# Patient Record
Sex: Female | Born: 1985 | Race: Asian | Hispanic: No | Marital: Single | Smoking: Never smoker
Health system: Southern US, Community
[De-identification: ages and names within clinical notes are randomized; demographics above are authoritative.]

## PROBLEM LIST (undated history)

## (undated) ENCOUNTER — Emergency Department (HOSPITAL_COMMUNITY): Discharge status: Against Medical Advice | Payer: BC Managed Care – PPO

## (undated) ENCOUNTER — Emergency Department (HOSPITAL_COMMUNITY): Admission: EM | Payer: Self-pay | Source: Home / Self Care

## (undated) ENCOUNTER — Inpatient Hospital Stay (HOSPITAL_COMMUNITY): Payer: Self-pay

## (undated) DIAGNOSIS — Z789 Other specified health status: Secondary | ICD-10-CM

## (undated) HISTORY — PX: KNEE SURGERY: SHX244

---

## 2009-03-19 ENCOUNTER — Emergency Department (HOSPITAL_COMMUNITY): Admission: EM | Admit: 2009-03-19 | Discharge: 2009-03-19 | Payer: Self-pay | Admitting: Emergency Medicine

## 2009-07-18 ENCOUNTER — Ambulatory Visit (HOSPITAL_COMMUNITY): Admission: RE | Admit: 2009-07-18 | Discharge: 2009-07-18 | Payer: Self-pay | Admitting: Obstetrics & Gynecology

## 2009-10-28 ENCOUNTER — Ambulatory Visit: Payer: Self-pay | Admitting: Advanced Practice Midwife

## 2009-10-28 ENCOUNTER — Inpatient Hospital Stay (HOSPITAL_COMMUNITY): Admission: AD | Admit: 2009-10-28 | Discharge: 2009-10-28 | Payer: Self-pay | Admitting: Obstetrics & Gynecology

## 2009-11-03 ENCOUNTER — Inpatient Hospital Stay (HOSPITAL_COMMUNITY): Admission: AD | Admit: 2009-11-03 | Discharge: 2009-11-07 | Payer: Self-pay | Admitting: Obstetrics & Gynecology

## 2009-11-03 ENCOUNTER — Ambulatory Visit: Payer: Self-pay | Admitting: Obstetrics & Gynecology

## 2009-11-03 ENCOUNTER — Encounter: Payer: Self-pay | Admitting: Obstetrics & Gynecology

## 2010-10-09 LAB — CULTURE, BLOOD (ROUTINE X 2)
Culture: NO GROWTH
Culture: NO GROWTH

## 2010-10-09 LAB — DIFFERENTIAL
Basophils Absolute: 0 10*3/uL (ref 0.0–0.1)
Basophils Absolute: 0 10*3/uL (ref 0.0–0.1)
Basophils Relative: 0 % (ref 0–1)
Basophils Relative: 0 % (ref 0–1)
Eosinophils Absolute: 0.1 10*3/uL (ref 0.0–0.7)
Lymphocytes Relative: 4 % — ABNORMAL LOW (ref 12–46)
Lymphs Abs: 0.5 10*3/uL — ABNORMAL LOW (ref 0.7–4.0)
Lymphs Abs: 1.1 10*3/uL (ref 0.7–4.0)
Neutro Abs: 12.2 10*3/uL — ABNORMAL HIGH (ref 1.7–7.7)
Neutro Abs: 7 10*3/uL (ref 1.7–7.7)
Neutrophils Relative %: 72 % (ref 43–77)
Neutrophils Relative %: 81 % — ABNORMAL HIGH (ref 43–77)
Neutrophils Relative %: 93 % — ABNORMAL HIGH (ref 43–77)

## 2010-10-09 LAB — CBC
MCHC: 34.2 g/dL (ref 30.0–36.0)
MCHC: 34.2 g/dL (ref 30.0–36.0)
MCV: 94.8 fL (ref 78.0–100.0)
Platelets: 117 10*3/uL — ABNORMAL LOW (ref 150–400)
Platelets: 129 10*3/uL — ABNORMAL LOW (ref 150–400)
Platelets: 135 10*3/uL — ABNORMAL LOW (ref 150–400)
RBC: 3.01 MIL/uL — ABNORMAL LOW (ref 3.87–5.11)
RBC: 4 MIL/uL (ref 3.87–5.11)
RDW: 15 % (ref 11.5–15.5)
RDW: 15.3 % (ref 11.5–15.5)
RDW: 15.4 % (ref 11.5–15.5)
WBC: 13.1 10*3/uL — ABNORMAL HIGH (ref 4.0–10.5)
WBC: 15.1 10*3/uL — ABNORMAL HIGH (ref 4.0–10.5)
WBC: 9.9 10*3/uL (ref 4.0–10.5)

## 2010-10-09 LAB — URINALYSIS, MICROSCOPIC ONLY
Bilirubin Urine: NEGATIVE
Ketones, ur: NEGATIVE mg/dL
Nitrite: NEGATIVE
Protein, ur: NEGATIVE mg/dL
Urobilinogen, UA: 0.2 mg/dL (ref 0.0–1.0)
pH: 6 (ref 5.0–8.0)

## 2010-10-09 LAB — RPR: RPR Ser Ql: NONREACTIVE

## 2010-10-09 LAB — URINE CULTURE
Colony Count: NO GROWTH
Culture: NO GROWTH
Special Requests: NEGATIVE

## 2010-10-09 LAB — CREATININE, SERUM
Creatinine, Ser: 0.6 mg/dL (ref 0.4–1.2)
GFR calc non Af Amer: >60 mL/min (ref >60)

## 2010-10-27 LAB — POCT I-STAT, CHEM 8
Glucose, Bld: 87 mg/dL (ref 70–99)
HCT: 38 % (ref 36.0–46.0)
Hemoglobin: 12.9 g/dL (ref 12.0–15.0)
Potassium: 3.4 mEq/L — ABNORMAL LOW (ref 3.5–5.1)
Sodium: 137 mEq/L (ref 135–145)
TCO2: 21 mmol/L (ref 0–100)

## 2010-10-27 LAB — URINALYSIS, ROUTINE W REFLEX MICROSCOPIC
Nitrite: NEGATIVE
Specific Gravity, Urine: 1.023 (ref 1.005–1.030)
Urobilinogen, UA: 0.2 mg/dL (ref 0.0–1.0)

## 2010-10-27 LAB — URINE MICROSCOPIC-ADD ON

## 2010-10-27 LAB — URINE CULTURE: Culture: NO GROWTH

## 2011-07-13 ENCOUNTER — Encounter: Payer: Self-pay | Admitting: *Deleted

## 2011-07-13 ENCOUNTER — Emergency Department (HOSPITAL_COMMUNITY)
Admission: EM | Admit: 2011-07-13 | Discharge: 2011-07-13 | Disposition: A | Discharge status: Home / Self Care | Payer: Self-pay | Attending: Emergency Medicine | Admitting: Emergency Medicine

## 2011-07-13 DIAGNOSIS — N390 Urinary tract infection, site not specified: Secondary | ICD-10-CM | POA: Insufficient documentation

## 2011-07-13 LAB — URINALYSIS, ROUTINE W REFLEX MICROSCOPIC
Bilirubin Urine: NEGATIVE
Ketones, ur: NEGATIVE mg/dL
Nitrite: POSITIVE — AB
Protein, ur: 30 mg/dL — AB
Specific Gravity, Urine: 1.025 (ref 1.005–1.030)
Urobilinogen, UA: 0.2 mg/dL (ref 0.0–1.0)

## 2011-07-13 LAB — URINE MICROSCOPIC-ADD ON

## 2011-07-13 MED ORDER — PHENAZOPYRIDINE HCL 200 MG PO TABS: 200.0000 mg | ORAL_TABLET | Freq: Three times a day (TID) | ORAL | Status: AC

## 2011-07-13 MED ORDER — CEPHALEXIN 500 MG PO CAPS: 500.0000 mg | ORAL_CAPSULE | Freq: Four times a day (QID) | ORAL | Status: AC

## 2011-07-13 NOTE — ED Notes (Signed)
Pt states she feels like she has "a urinary tract infection" reports frequent urination

## 2011-07-13 NOTE — ED Provider Notes (Signed)
History     CSN: [id number]  Arrival date & time 07/13/11  1232   First MD Initiated Contact with Patient 07/13/11 1506      Chief Complaint  Patient presents with  . Urinary Tract Infection  . Urinary Frequency    (Consider location/radiation/quality/duration/timing/severity/associated sxs/prior treatment) Patient is a 25 y.o. female presenting with frequency. The history is provided by the patient.  Urinary Frequency  reports dysuria and urinary frequency x 24 hours. No vomiting, abdominal pain, nausea, fever, chills of flank pain. Denies vaginal discharge. Constant. Moderate in severity. No other associated symptoms  History reviewed. No pertinent past medical history.  Past Surgical History  Procedure Date  . Cesarean section     No family history on file.  History  Substance Use Topics  . Smoking status: Never Smoker   . Smokeless tobacco: Not on file  . Alcohol Use: Yes    OB History    Grav Para Term Preterm Abortions TAB SAB Ect Mult Living                  Review of Systems  Genitourinary: Positive for frequency.  All other systems reviewed and are negative.    Allergies  Penicillins  Home Medications   Current Outpatient Rx  Name Route Sig Dispense Refill  . CEPHALEXIN 500 MG PO CAPS Oral Take 1 capsule (500 mg total) by mouth 4 (four) times daily. 20 capsule 0  . PHENAZOPYRIDINE HCL 200 MG PO TABS Oral Take 1 tablet (200 mg total) by mouth 3 (three) times daily. 6 tablet 0    BP 111/68  Pulse 88  Temp(Src) 98.1 F (36.7 C) (Oral)  Resp 16  SpO2 99%  LMP 06/15/2011  Physical Exam  Nursing note and vitals reviewed. Constitutional: She is oriented to person, place, and time. She appears well-developed and well-nourished. No distress.  HENT:  Head: Normocephalic and atraumatic.  Eyes: EOM are normal.  Neck: Normal range of motion.  Cardiovascular: Normal rate and regular rhythm.   Pulmonary/Chest: Effort normal and breath sounds  normal.  Abdominal: Soft. She exhibits no distension. There is no tenderness.  Musculoskeletal: Normal range of motion.  Neurological: She is alert and oriented to person, place, and time.  Skin: Skin is warm and dry.  Psychiatric: She has a normal mood and affect. Judgment normal.    ED Course  Procedures (including critical care time)  Labs Reviewed  URINALYSIS, ROUTINE W REFLEX MICROSCOPIC - Abnormal; Notable for the following:    APPearance CLOUDY (*)    Hgb urine dipstick MODERATE (*)    Protein, ur 30 (*)    Nitrite POSITIVE (*)    Leukocytes, UA MODERATE (*)    All other components within normal limits  URINE MICROSCOPIC-ADD ON - Abnormal; Notable for the following:    Bacteria, UA MANY (*)    All other components within normal limits  PREGNANCY, URINE   No results found.   1. Urinary tract infection       MDM  Will tx for uncomplicated UTI        Lyanne Co, MD 07/13/11 [phone]

## 2012-03-30 ENCOUNTER — Emergency Department (HOSPITAL_COMMUNITY)
Admission: EM | Admit: 2012-03-30 | Discharge: 2012-03-30 | Disposition: A | Discharge status: Home / Self Care | Payer: Self-pay

## 2012-03-30 NOTE — ED Notes (Signed)
Pt called multiple times with no reply for triage

## 2012-09-30 ENCOUNTER — Emergency Department (HOSPITAL_COMMUNITY)
Admission: EM | Admit: 2012-09-30 | Discharge: 2012-10-01 | Disposition: A | Discharge status: Home / Self Care | Payer: BC Managed Care – PPO | Attending: Emergency Medicine | Admitting: Emergency Medicine

## 2012-09-30 ENCOUNTER — Encounter (HOSPITAL_COMMUNITY): Payer: Self-pay | Admitting: *Deleted

## 2012-09-30 DIAGNOSIS — R45851 Suicidal ideations: Secondary | ICD-10-CM | POA: Insufficient documentation

## 2012-09-30 DIAGNOSIS — X789XXA Intentional self-harm by unspecified sharp object, initial encounter: Secondary | ICD-10-CM | POA: Insufficient documentation

## 2012-09-30 DIAGNOSIS — F502 Bulimia nervosa, unspecified: Secondary | ICD-10-CM | POA: Insufficient documentation

## 2012-09-30 DIAGNOSIS — F3130 Bipolar disorder, current episode depressed, mild or moderate severity, unspecified: Secondary | ICD-10-CM | POA: Insufficient documentation

## 2012-09-30 DIAGNOSIS — S51809A Unspecified open wound of unspecified forearm, initial encounter: Secondary | ICD-10-CM | POA: Insufficient documentation

## 2012-09-30 DIAGNOSIS — Z79899 Other long term (current) drug therapy: Secondary | ICD-10-CM | POA: Insufficient documentation

## 2012-09-30 DIAGNOSIS — Z3202 Encounter for pregnancy test, result negative: Secondary | ICD-10-CM | POA: Insufficient documentation

## 2012-09-30 LAB — CBC WITH DIFFERENTIAL/PLATELET
Basophils Absolute: 0 10*3/uL (ref 0.0–0.1)
Eosinophils Relative: 1 % (ref 0–5)
HCT: 38 % (ref 36.0–46.0)
Hemoglobin: 13.8 g/dL (ref 12.0–15.0)
Lymphocytes Relative: 45 % (ref 12–46)
Lymphs Abs: 4 10*3/uL (ref 0.7–4.0)
MCV: 93.8 fL (ref 78.0–100.0)
Monocytes Absolute: 0.4 10*3/uL (ref 0.1–1.0)
Neutro Abs: 4.3 10*3/uL (ref 1.7–7.7)
RBC: 4.05 MIL/uL (ref 3.87–5.11)
RDW: 12 % (ref 11.5–15.5)
WBC: 8.8 10*3/uL (ref 4.0–10.5)

## 2012-09-30 LAB — ETHANOL: Alcohol, Ethyl (B): 110 mg/dL — ABNORMAL HIGH (ref 0–11)

## 2012-09-30 LAB — BASIC METABOLIC PANEL
CO2: 23 mEq/L (ref 19–32)
Chloride: 109 mEq/L (ref 96–112)
Creatinine, Ser: 0.74 mg/dL (ref 0.50–1.10)
Glucose, Bld: 71 mg/dL (ref 70–99)

## 2012-09-30 MED ORDER — ACETAMINOPHEN 325 MG PO TABS: 650.0000 mg | ORAL_TABLET | ORAL | Status: DC | PRN

## 2012-09-30 MED ORDER — IBUPROFEN 600 MG PO TABS: 600.0000 mg | ORAL_TABLET | Freq: Three times a day (TID) | ORAL | Status: DC | PRN

## 2012-09-30 MED ORDER — ALUM & MAG HYDROXIDE-SIMETH 200-200-20 MG/5ML PO SUSP: 30.0000 mL | ORAL | Status: DC | PRN

## 2012-09-30 MED ORDER — NICOTINE 21 MG/24HR TD PT24: 21.0000 mg | MEDICATED_PATCH | Freq: Every day | TRANSDERMAL | Status: DC

## 2012-09-30 MED ORDER — ZOLPIDEM TARTRATE 5 MG PO TABS
5.0000 mg | ORAL_TABLET | Freq: Every evening | ORAL | Status: DC | PRN
  Administered 2012-10-01: 5 mg via ORAL
  Filled 2012-09-30: qty 1

## 2012-09-30 MED ORDER — LORAZEPAM 1 MG PO TABS
1.0000 mg | ORAL_TABLET | Freq: Three times a day (TID) | ORAL | Status: DC | PRN
  Administered 2012-10-01: 1 mg via ORAL
  Filled 2012-09-30: qty 1

## 2012-09-30 MED ORDER — ONDANSETRON HCL 4 MG PO TABS: 4.0000 mg | ORAL_TABLET | Freq: Three times a day (TID) | ORAL | Status: DC | PRN
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
## 2012-09-30 NOTE — ED Provider Notes (Signed)
History    This chart was scribed for Glade Nurse, PA-C a non-physician practitioner working with Flint Melter, MD by Lewanda Rife, ED Scribe. This patient was seen in room [facility] and the patient's care was started at 2259.     CSN: [id number]  Arrival date & time 09/30/12  2149   First MD Initiated Contact with Patient 09/30/12 2256      Chief Complaint  Patient presents with  . Medical Clearance    (Consider location/radiation/quality/duration/timing/severity/associated sxs/prior treatment) HPI Virginia Hunter is a 27 y.o. female brought in by ambulance, who presents to the Emergency Department complaining of intermittent suicidal ideations onset 7 days. Pt reports superficial cutting on left forearm with razors 7 days ago and states precipitant of depression is "life stressors." Pt admits to SI, but denies having a plan of suicide. Pt denies homicidal ideations, auditory/visual hallucinations. Pt reports having several glasses of Christiane Ha today and 1 xanax. Denies any other drug use. Pt reports hx of the same for past 7 years. Pt reports she called her psychiatrist today and was advised to come to ED. Pt reports tetanus status is unknown.   History reviewed. No pertinent past medical history.  Past Surgical History  Procedure Laterality Date  . Cesarean section      No family history on file.  History  Substance Use Topics  . Smoking status: Never Smoker   . Smokeless tobacco: Not on file  . Alcohol Use: Yes    OB History   Grav Para Term Preterm Abortions TAB SAB Ect Mult Living                  Review of Systems  Constitutional: Negative for fever and diaphoresis.  HENT: Negative for neck pain and neck stiffness.   Eyes: Negative for visual disturbance.  Respiratory: Negative for apnea, chest tightness and shortness of breath.   Cardiovascular: Negative for chest pain and palpitations.  Gastrointestinal: Negative for nausea, vomiting,  diarrhea and constipation.  Genitourinary: Negative for dysuria.  Musculoskeletal: Negative for gait problem.  Skin:       Healing, scabbed superficial lacerations to left forearm.  Neurological: Negative for dizziness, weakness, light-headedness, numbness and headaches.  Psychiatric/Behavioral: Positive for suicidal ideas and self-injury. Negative for hallucinations. The patient is nervous/anxious.    A complete 10 system review of systems was obtained and all systems are negative except as noted in the HPI and PMH.   Allergies  Penicillins  Home Medications   Current Outpatient Rx  Name  Route  Sig  Dispense  Refill  . ALPRAZolam (XANAX PO)   Oral   Take 1 tablet by mouth every morning.         Marland Kitchen HYDROcodone-acetaminophen (NORCO/VICODIN) 5-325 MG per tablet   Oral   Take 1 tablet by mouth every 6 (six) hours as needed for pain.           BP 116/76  Pulse 118  Temp(Src) 98.4 F (36.9 C) (Oral)  Resp 18  SpO2 100%  LMP 09/16/2012  Physical Exam  Nursing note and vitals reviewed. Constitutional: She is oriented to person, place, and time.  disheveled  HENT:  Head: Normocephalic and atraumatic.  Eyes: Conjunctivae and EOM are normal.  Neck: Normal range of motion. Neck supple.  No meningeal signs  Cardiovascular: Normal rate.   Pulmonary/Chest: Effort normal and breath sounds normal. No respiratory distress. She has no wheezes. She has no rales. She exhibits no tenderness.  Abdominal:  Soft.  Musculoskeletal: Normal range of motion. She exhibits no edema and no tenderness.  Neurological: She is alert and oriented to person, place, and time.  Skin: Skin is warm and dry. Laceration noted. She is not diaphoretic. No erythema.     Multiple scabbed over superficial lacerations to left forearm healing appropriately. Not purulent, warm to touch.  Psychiatric: Her speech is normal. She is withdrawn. She is not actively hallucinating. She expresses inappropriate judgment.  She exhibits a depressed mood. She expresses suicidal ideation. She expresses no homicidal ideation. She expresses no suicidal plans and no homicidal plans.  Crying intermittently, hiding face with hair    ED Course  Procedures (including critical care time) Medications  LORazepam (ATIVAN) tablet 1 mg (not administered)  acetaminophen (TYLENOL) tablet 650 mg (not administered)  ibuprofen (ADVIL,MOTRIN) tablet 600 mg (not administered)  zolpidem (AMBIEN) tablet 5 mg (not administered)  nicotine (NICODERM CQ - dosed in mg/24 hours) patch 21 mg (not administered)  ondansetron (ZOFRAN) tablet 4 mg (not administered)  alum & mag hydroxide-simeth (MAALOX/MYLANTA) 200-200-20 MG/5ML suspension 30 mL (not administered)    Labs Reviewed  CBC WITH DIFFERENTIAL - Abnormal; Notable for the following:    MCH 34.1 (*)    MCHC 36.3 (*)    All other components within normal limits  ETHANOL - Abnormal; Notable for the following:    Alcohol, Ethyl (B) 110 (*)    All other components within normal limits  URINE RAPID DRUG SCREEN (HOSP PERFORMED) - Abnormal; Notable for the following:    Benzodiazepines POSITIVE (*)    Tetrahydrocannabinol POSITIVE (*)    All other components within normal limits  BASIC METABOLIC PANEL  PREGNANCY, URINE   No results found.   No diagnosis found.    MDM  Pt expresses SI without plan tonight. Pt has been suicidal and depressed (reasons for which she did not elaborate indicating life stressors) for the past week, having cut up her left arm two days ago. Pt presents to the ED for medical clearance.  The patient's demeanor is dysphoric. Admits difficulty sleeping, loss of interests, feelings of worthlessness, lack of energy, difficulty concentrating, loss of appetite, feelings of anxiety.  The patient currently does not have any acute physical complaints and is in no acute distress.ACT consult was appreciated and pt was moved to Psych ED for further evaluation.  I  personally performed the services described in this documentation, which was scribed in my presence. The recorded information has been reviewed and is accurate.    Glade Nurse, PA-C 10/01/12 [phone]

## 2012-09-30 NOTE — ED Notes (Signed)
Patient arrived to unit, pt tearful but currently verbally contracts for safety.

## 2012-10-01 DIAGNOSIS — F502 Bulimia nervosa: Secondary | ICD-10-CM | POA: Diagnosis present

## 2012-10-01 DIAGNOSIS — F3130 Bipolar disorder, current episode depressed, mild or moderate severity, unspecified: Secondary | ICD-10-CM

## 2012-10-01 DIAGNOSIS — F1210 Cannabis abuse, uncomplicated: Secondary | ICD-10-CM

## 2012-10-01 LAB — RAPID URINE DRUG SCREEN, HOSP PERFORMED
Amphetamines: NOT DETECTED
Benzodiazepines: POSITIVE — AB
Opiates: NOT DETECTED
Tetrahydrocannabinol: POSITIVE — AB

## 2012-10-01 LAB — PREGNANCY, URINE: Preg Test, Ur: NEGATIVE

## 2012-10-01 MED ORDER — LAMOTRIGINE 25 MG PO TABS: 25.0000 mg | ORAL_TABLET | Freq: Two times a day (BID) | ORAL | Status: DC

## 2012-10-01 MED ORDER — FLUOXETINE HCL 10 MG PO CAPS: 10.0000 mg | ORAL_CAPSULE | Freq: Every day | ORAL | Status: DC

## 2012-10-01 MED ORDER — TETANUS-DIPHTH-ACELL PERTUSSIS 5-2.5-18.5 LF-MCG/0.5 IM SUSP
0.5000 mL | Freq: Once | INTRAMUSCULAR | Status: DC
  Filled 2012-10-01: qty 0.5

## 2012-10-01 MED ORDER — ZOLPIDEM TARTRATE 5 MG PO TABS: 5.0000 mg | ORAL_TABLET | Freq: Every evening | ORAL | Status: DC | PRN
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
## 2012-10-01 NOTE — ED Provider Notes (Signed)
Medical screening examination/treatment/procedure(s) were performed by non-physician practitioner and as supervising physician I was immediately available for consultation/collaboration.  Flint Melter, MD 10/01/12 [phone]

## 2012-10-01 NOTE — BHH Counselor (Signed)
Patient discharged home per Dr. Elsie Saas. Patient was given several community mental health referrals.

## 2012-10-01 NOTE — ED Notes (Signed)
Pt is aware that she is going to be dc'd.  Pt reports she is safe going home.  Pt encouraged to follow up w/ her psychitrist as soon and possible and return/seek treatment if she has any thoughts of harming herself.  Pt verbalized understanding and reported that she would.

## 2012-10-01 NOTE — Progress Notes (Signed)
CSW faxed the patient for review at the following hospitals: - [facility] [phone] [facility] [phone] - [place] [phone] - [facility] [phone] [facility] [phone] [facility] [phone] Earlene Plater [phone] [facility] [phone] - [place] [phone] - [address] [facility] [phone] - [facility] [phone]  There were no beds available at: [place], [place], [place], [place], [place], [place]

## 2012-10-01 NOTE — ED Notes (Signed)
Up to the desk on the phone 

## 2012-10-01 NOTE — BHH Counselor (Signed)
3/13 Pt referred to [facility],  [facility], [place],   [facility], [facility] expecting d/c's later today, [facility] possible may have a geri-psych admission,   [facility], [place], [place], [place], and [facility]   3/13 There were no beds available at: [address], [place], [place], [place], [place], and [place].  3/13 Patient referred to [place] declined at [facility] due to bulimia. Sts that bulimia is exclusionary criteria for their facility.

## 2012-10-01 NOTE — ED Notes (Signed)
Waiting for ride to arrive
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
## 2012-10-01 NOTE — ED Notes (Signed)
Written dc instructions reviewed w/ pt.  Pt encouraged to follow up as instructed and return for toughts/impulses to harm herself, pt agreed to do so, and verbalized understanding of instructions.  Belongings returned after leaving the unit.

## 2012-10-01 NOTE — ED Notes (Signed)
Pt's husband is here to pick her up.

## 2012-10-01 NOTE — Progress Notes (Signed)
Pt has been accepted to [facility] pending IVC paperwork being served.   Accepting Physician: [place] Report #: [phone] Fax: [phone]   CSW will ask evening CSW to f/u with Magistrate re: IVC paperwork and advise EDP and RN when pt has been served to d/c to [facility].    Vickii Penna, LCSWA [phone]  Clinical Social Work

## 2012-10-01 NOTE — ED Provider Notes (Signed)
Pt awaiting placement Labs/vitals reviewed D/w ACT about her care and attempting placement BP 107/71  Pulse 78  Temp(Src) 98.3 F (36.8 C) (Oral)  Resp 16  SpO2 98%  LMP 09/16/2012   Joya Gaskins, MD 10/01/12 [phone]

## 2012-10-01 NOTE — ED Notes (Signed)
Dr. Elsie Saas into see

## 2012-10-01 NOTE — Progress Notes (Signed)
Pt was denied at [facility] and [place] based on acuity- no programs for eating disorders. Vickii Penna, LCSWA [phone]  Clinical Social Work

## 2012-10-01 NOTE — Progress Notes (Signed)
Spoke with Reuel Boom at [facility] no beds  Spoke with Rosann Auerbach at [employer] with Toniann Fail at [facility] one female bed available/ information faxed.
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
## 2012-10-01 NOTE — ED Notes (Signed)
Up to the bathroom 

## 2013-05-21 LAB — OB RESULTS CONSOLE ABO/RH: RH TYPE: POSITIVE

## 2013-05-21 LAB — OB RESULTS CONSOLE ANTIBODY SCREEN: Antibody Screen: NEGATIVE

## 2013-05-21 LAB — OB RESULTS CONSOLE HEPATITIS B SURFACE ANTIGEN: Hepatitis B Surface Ag: NEGATIVE

## 2013-05-21 LAB — OB RESULTS CONSOLE HIV ANTIBODY (ROUTINE TESTING): HIV: NONREACTIVE

## 2013-05-21 LAB — OB RESULTS CONSOLE RUBELLA ANTIBODY, IGM: Rubella: IMMUNE

## 2013-05-21 LAB — OB RESULTS CONSOLE RPR: RPR: NONREACTIVE

## 2013-05-26 ENCOUNTER — Other Ambulatory Visit: Payer: Self-pay | Admitting: Obstetrics & Gynecology

## 2013-05-26 ENCOUNTER — Other Ambulatory Visit (HOSPITAL_COMMUNITY)
Admission: RE | Admit: 2013-05-26 | Discharge: 2013-05-26 | Disposition: A | Discharge status: Home / Self Care | Payer: BC Managed Care – PPO | Source: Ambulatory Visit | Attending: Obstetrics & Gynecology | Admitting: Obstetrics & Gynecology

## 2013-05-26 DIAGNOSIS — Z01419 Encounter for gynecological examination (general) (routine) without abnormal findings: Secondary | ICD-10-CM | POA: Insufficient documentation

## 2013-05-26 DIAGNOSIS — R87810 Cervical high risk human papillomavirus (HPV) DNA test positive: Secondary | ICD-10-CM | POA: Insufficient documentation

## 2013-05-26 DIAGNOSIS — Z1151 Encounter for screening for human papillomavirus (HPV): Secondary | ICD-10-CM | POA: Insufficient documentation

## 2013-05-26 DIAGNOSIS — Z113 Encounter for screening for infections with a predominantly sexual mode of transmission: Secondary | ICD-10-CM | POA: Insufficient documentation

## 2013-05-27 ENCOUNTER — Other Ambulatory Visit: Payer: Self-pay

## 2013-07-28 ENCOUNTER — Other Ambulatory Visit (HOSPITAL_COMMUNITY): Payer: Self-pay | Admitting: Obstetrics & Gynecology

## 2013-07-28 DIAGNOSIS — Z3689 Encounter for other specified antenatal screening: Secondary | ICD-10-CM

## 2013-07-28 DIAGNOSIS — O09891 Supervision of other high risk pregnancies, first trimester: Secondary | ICD-10-CM

## 2013-07-29 ENCOUNTER — Encounter (HOSPITAL_COMMUNITY): Payer: Self-pay | Admitting: Obstetrics & Gynecology

## 2013-08-10 ENCOUNTER — Encounter (HOSPITAL_COMMUNITY): Payer: BC Managed Care – PPO

## 2013-08-10 ENCOUNTER — Ambulatory Visit (HOSPITAL_COMMUNITY): Payer: BC Managed Care – PPO

## 2013-08-13 ENCOUNTER — Encounter (HOSPITAL_COMMUNITY): Payer: Self-pay

## 2013-08-13 ENCOUNTER — Ambulatory Visit (HOSPITAL_COMMUNITY)
Admission: RE | Admit: 2013-08-13 | Discharge: 2013-08-13 | Disposition: A | Discharge status: Home / Self Care | Payer: BC Managed Care – PPO | Source: Ambulatory Visit | Attending: Obstetrics & Gynecology | Admitting: Obstetrics & Gynecology

## 2013-08-13 DIAGNOSIS — O34219 Maternal care for unspecified type scar from previous cesarean delivery: Secondary | ICD-10-CM | POA: Insufficient documentation

## 2013-08-13 DIAGNOSIS — Z1389 Encounter for screening for other disorder: Secondary | ICD-10-CM | POA: Insufficient documentation

## 2013-08-13 DIAGNOSIS — Z363 Encounter for antenatal screening for malformations: Secondary | ICD-10-CM | POA: Insufficient documentation

## 2013-08-13 DIAGNOSIS — Z3689 Encounter for other specified antenatal screening: Secondary | ICD-10-CM

## 2013-08-13 DIAGNOSIS — F1910 Other psychoactive substance abuse, uncomplicated: Secondary | ICD-10-CM | POA: Insufficient documentation

## 2013-08-13 DIAGNOSIS — O99340 Other mental disorders complicating pregnancy, unspecified trimester: Secondary | ICD-10-CM | POA: Insufficient documentation

## 2013-08-13 DIAGNOSIS — F502 Bulimia nervosa: Secondary | ICD-10-CM

## 2013-08-13 DIAGNOSIS — O09891 Supervision of other high risk pregnancies, first trimester: Secondary | ICD-10-CM

## 2013-08-13 DIAGNOSIS — F3130 Bipolar disorder, current episode depressed, mild or moderate severity, unspecified: Secondary | ICD-10-CM

## 2013-08-13 DIAGNOSIS — O358XX0 Maternal care for other (suspected) fetal abnormality and damage, not applicable or unspecified: Secondary | ICD-10-CM | POA: Insufficient documentation

## 2013-08-13 NOTE — Consult Note (Signed)
Maternal Fetal Medicine Consultation   Requesting Provider(s): Victorino Dike Ozan,DO  Reason for consultation: Hx of Depakote and Lamictal exposure  HPI: Virginia Hunter is a 28 yo G2P1001, EDD 01/09/2014 currently at 18 5/7 weeks who is referred for consultation due to Depakote and Lamictal exposure.  Ms. Virginia Hunter was previously on these medications for bipolar disorder - stopped both Depakote and Lamictal once she found out that she was pregnant.  She is otherwise without complaints and reports that her depression is "stable" off medications.  Her prenatal course thus far has been uncomplicated.  Her past OB history is remarkable for a prior C-section at [redacted] weeks gestation due to non reassuring fetal tracing.  The newborn was affected with meconium aspiration.  OB History: OB History   Grav Para Term Preterm Abortions TAB SAB Ect Mult Living   3 1 1       1     as above  PMH: Bipolar disorder, depression  PSH:  Past Surgical History  Procedure Laterality Date  . Cesarean section     Meds:  Current Outpatient Prescriptions on File Prior to Encounter  Medication Sig Dispense Refill  . ALPRAZolam (XANAX PO) Take 1 tablet by mouth every morning.      Marland Kitchen FLUoxetine (PROZAC) 10 MG capsule Take 1 capsule (10 mg total) by mouth daily.  15 capsule  0  . HYDROcodone-acetaminophen (NORCO/VICODIN) 5-325 MG per tablet Take 1 tablet by mouth every 6 (six) hours as needed for pain.      Marland Kitchen lamoTRIgine (LAMICTAL) 25 MG tablet Take 1 tablet (25 mg total) by mouth 2 (two) times daily.  30 tablet  0  . zolpidem (AMBIEN) 5 MG tablet Take 1 tablet (5 mg total) by mouth at bedtime as needed for sleep.  15 tablet  0   No current facility-administered medications on file prior to encounter.   Allergies:  Allergies  Allergen Reactions  . Penicillins Other (See Comments)    Unknown reaction   FH: No family history on file.  Adopted  Soc:  History  Smoking status  . Never Smoker   Smokeless  tobacco  . Not on file   History  Alcohol Use  . Yes    Review of Systems: no vaginal bleeding or cramping/contractions, no LOF, no nausea/vomiting. All other systems reviewed and are negative.  PE:  100/64, 140#, 82  GEN: well-appearing female ABD: gravid, NT  Ultrasound: Single IUP at 18 5/7 weeks Hx of Depakote and Lamictal exposure Normal fetal anatomic survey Somewhat limited views of the fetal face obtained (lips/ nose) No markers associated with aneuploidy noted Normal amniotic fluid volume   A/P: 1) Single IUP at 18 5/7 weeks         2) Hx of Depakote and Lamictal exposure - Depakote is associated with a 1-2% risk for meningomyelocele and may be associated with neurodevelopmental delays in the newborn.  There are some reports that Lamictal may be associated with oral clefts, but not confirmed in all studies.  Normal fetal anatomy was noted on today's study.  Ultrasound alone is able to detect approximately 90+% of all open neural tube defects and feel very reassured by today's study.  The fetal face was not well-visualized on today's study, and recommend follow up in 4 weeks to reevaluate given the possible increased risk for oral clefts.         3) Hx of bipolar disorder - recommend follow up with the patient's therapist - now the patient  is outside of the first trimester, would not be against resuming medications as felt appropriate to better control bipolar disorder.   Thank you for the opportunity to be a part of the care of Virginia Hunter. Please contact our office if we can be of further assistance.   I spent approximately 30 minutes with this patient with over 50% of time spent in face-to-face counseling.  Alpha GulaPaul Refugia Laneve, MD Maternal Fetal Medicine

## 2013-09-10 ENCOUNTER — Ambulatory Visit (HOSPITAL_COMMUNITY): Payer: BC Managed Care – PPO

## 2013-12-18 ENCOUNTER — Inpatient Hospital Stay (HOSPITAL_COMMUNITY)
Admission: AD | Admit: 2013-12-18 | Discharge: 2013-12-18 | Disposition: A | Discharge status: Home / Self Care | Payer: BC Managed Care – PPO | Source: Ambulatory Visit | Attending: Obstetrics and Gynecology | Admitting: Obstetrics and Gynecology

## 2013-12-18 ENCOUNTER — Encounter (HOSPITAL_COMMUNITY): Payer: Self-pay | Admitting: *Deleted

## 2013-12-18 DIAGNOSIS — O9989 Other specified diseases and conditions complicating pregnancy, childbirth and the puerperium: Principal | ICD-10-CM

## 2013-12-18 DIAGNOSIS — O99891 Other specified diseases and conditions complicating pregnancy: Secondary | ICD-10-CM | POA: Insufficient documentation

## 2013-12-18 DIAGNOSIS — N949 Unspecified condition associated with female genital organs and menstrual cycle: Secondary | ICD-10-CM | POA: Insufficient documentation

## 2013-12-18 DIAGNOSIS — L299 Pruritus, unspecified: Secondary | ICD-10-CM | POA: Insufficient documentation

## 2013-12-18 DIAGNOSIS — O34219 Maternal care for unspecified type scar from previous cesarean delivery: Secondary | ICD-10-CM | POA: Insufficient documentation

## 2013-12-18 DIAGNOSIS — O4700 False labor before 37 completed weeks of gestation, unspecified trimester: Secondary | ICD-10-CM | POA: Insufficient documentation

## 2013-12-18 MED ORDER — HYDROXYZINE PAMOATE 50 MG PO CAPS: 50.0000 mg | ORAL_CAPSULE | Freq: Three times a day (TID) | ORAL | Status: DC | PRN

## 2013-12-18 NOTE — Discharge Instructions (Signed)

## 2013-12-18 NOTE — MAU Note (Signed)
Pt presents to MAU with complaints of contractions that started this morning around 620. Denies any vaginal bleeding or LOF  Pt has a repeat C/S scheduled for June the 15th

## 2013-12-18 NOTE — MAU Provider Note (Signed)
History   28 yo G3P1011 at 32 3/7 weeks presented unannounced c/o pelvic pressure and ? Contractions.  Has also struggled with itching--on ursodiol, but reports "labs were normal".  Reports Benadryl has been ineffective for itching. Denies leaking or bleeding, reports +FM.  Reports baby has been breech.  Scheduled for repeat C/S on 6/15 with Dr. Charlotta Newton.  Patient Active Problem List   Diagnosis Date Noted  . Bipolar disorder with current episode depressed 10/01/2012  . Bulimia nervosa, purging type 10/01/2012    Chief Complaint  Patient presents with  . Contractions   HPI:  As above  OB History   Grav Para Term Preterm Abortions TAB SAB Ect Mult Living   3 1 1       1       No past medical history on file.  Past Surgical History  Procedure Laterality Date  . Cesarean section      No family history on file.  History  Substance Use Topics  . Smoking status: Never Smoker   . Smokeless tobacco: Not on file  . Alcohol Use: Yes    Allergies:  Allergies  Allergen Reactions  . Penicillins Other (See Comments)    Unknown reaction    Prescriptions prior to admission  Medication Sig Dispense Refill  . acetaminophen (TYLENOL) 500 MG tablet Take 1,000 mg by mouth every 6 (six) hours as needed for headache.      . diphenhydrAMINE (BENADRYL) 12.5 MG/5ML elixir Take 25 mg by mouth 2 (two) times daily as needed for itching.       . Prenatal Vit-Fe Fumarate-FA (PRENATAL MULTIVITAMIN) TABS tablet Take 1 tablet by mouth daily at 12 noon.      . ursodiol (ACTIGALL) 300 MG capsule Take 300 mg by mouth daily.        ROS:  Pelvic pressure, cramping, + FM, itching. Physical Exam   Blood pressure 110/63, pulse 101, temperature 97.6 F (36.4 C), temperature source Oral, resp. rate 18, height 5\' 2"  (1.575 m), weight 170 lb (77.111 kg), last menstrual period 03/29/2013.  Physical Exam Appears comfortable overall, but grimaces with FM. Chest clear Heart RRR without murmur Abd gravid,  NT--skin reddened on sides from itching.  No rash noted.  Vtx to [place]. Negative CVAT Pelvic--cervix FT/1, thick, pp high.    Ext WNL  FHR Category 1 UCs--very occasional, mild irritability  Bedside US--vtx presentation.  ED Course  Assessment: IUP at 36 6/7 weeks Musculoskeletal discomforts of pregnancy Diffuse itching Previous C/S, scheduled for repeat 01/03/14.  Plan: D/C home with comfort measures and reassurance. Rx Vistaril 50 mg po TID prn for itching. Keep scheduled appt at [facility] on Friday, or call/return prn.   Nigel Bridgeman CNM, MSN 12/18/2013 10:03 AM

## 2013-12-24 ENCOUNTER — Inpatient Hospital Stay (HOSPITAL_COMMUNITY): Payer: BC Managed Care – PPO

## 2013-12-24 ENCOUNTER — Inpatient Hospital Stay (HOSPITAL_COMMUNITY)
Admission: AD | Admit: 2013-12-24 | Discharge: 2013-12-24 | Disposition: A | Discharge status: Home / Self Care | Payer: BC Managed Care – PPO | Source: Ambulatory Visit | Attending: Obstetrics & Gynecology | Admitting: Obstetrics & Gynecology

## 2013-12-24 ENCOUNTER — Encounter (HOSPITAL_COMMUNITY): Payer: Self-pay | Admitting: General Practice

## 2013-12-24 DIAGNOSIS — O99891 Other specified diseases and conditions complicating pregnancy: Secondary | ICD-10-CM | POA: Insufficient documentation

## 2013-12-24 DIAGNOSIS — O9989 Other specified diseases and conditions complicating pregnancy, childbirth and the puerperium: Principal | ICD-10-CM

## 2013-12-24 NOTE — Discharge Instructions (Signed)

## 2013-12-24 NOTE — MAU Note (Signed)
Patient states she has been gushes of clear fluid off and on for the past 2 days. Denies contractions. Reports good fetal movement. Scheduled for a repeat cesarean section on 6-15.  Was seen in the office today and had a negative nitrazine. Sent to MAU for further evaluation.

## 2013-12-30 ENCOUNTER — Encounter (HOSPITAL_COMMUNITY): Payer: Self-pay | Admitting: Pharmacist

## 2013-12-31 ENCOUNTER — Encounter (HOSPITAL_COMMUNITY): Payer: Self-pay

## 2013-12-31 ENCOUNTER — Encounter (HOSPITAL_COMMUNITY)
Admission: RE | Admit: 2013-12-31 | Discharge: 2013-12-31 | Disposition: A | Discharge status: Home / Self Care | Payer: BC Managed Care – PPO | Source: Ambulatory Visit | Attending: Obstetrics & Gynecology | Admitting: Obstetrics & Gynecology

## 2013-12-31 HISTORY — DX: Other specified health status: Z78.9

## 2013-12-31 LAB — CBC
HCT: 33.1 % — ABNORMAL LOW (ref 36.0–46.0)
Hemoglobin: 11.1 g/dL — ABNORMAL LOW (ref 12.0–15.0)
MCH: 30.8 pg (ref 26.0–34.0)
MCHC: 33.5 g/dL (ref 30.0–36.0)
MCV: 91.9 fL (ref 78.0–100.0)
Platelets: 169 10*3/uL (ref 150–400)
RBC: 3.6 MIL/uL — ABNORMAL LOW (ref 3.87–5.11)
RDW: 13.5 % (ref 11.5–15.5)
WBC: 7.2 10*3/uL (ref 4.0–10.5)

## 2013-12-31 LAB — TYPE AND SCREEN
ABO/RH(D): A POS
Antibody Screen: NEGATIVE

## 2013-12-31 LAB — ABO/RH: ABO/RH(D): A POS

## 2013-12-31 NOTE — Patient Instructions (Signed)
20 Bryon LionsKimberly S Hermance  12/31/2013   Your procedure is scheduled on:  01/03/14  [facility]30 [facility].  Pick up the phone at the desk and dial [phone].   Call this number if you have problems the morning of surgery: [phone]   Remember:   Do not eat food:After Midnight.  Do not drink clear liquids: After Midnight.  Take these medicines the morning of surgery with A SIP OF WATER: NA   Do not wear jewelry, make-up or nail polish.  Do not wear lotions, powders, or perfumes. You may wear deodorant.  Do not shave 48 hours prior to surgery.  Do not bring valuables to the hospital.  [facility] is not   responsible for any belongings or valuables brought to the hospital.  Contacts, dentures or bridgework may not be worn into surgery.  Leave suitcase in the car. After surgery it may be brought to your room.  For patients admitted to the hospital, checkout time is 11:00 AM the day of              discharge.   Patients discharged the day of surgery will not be allowed to drive             home.  Name and phone number of your driver: NA  Special Instructions:      Please read over the following fact sheets that you were given:   Surgical Site Infection Prevention

## 2014-01-01 LAB — RPR

## 2014-01-02 MED ORDER — GENTAMICIN SULFATE 40 MG/ML IJ SOLN
INTRAVENOUS | Status: AC
  Administered 2014-01-03: 100 mL via INTRAVENOUS
  Filled 2014-01-02: qty 7.75

## 2014-01-02 NOTE — H&P (Signed)
HPI: Virginia Hunter is a 28 y.o. female presenting for G2P1001@[redacted]w[redacted]d  who presentes for scheduled repeat C-section. Pt has had one prior LTCS and desires repeat C-section. Pregnancy complicated by:  Ellie Lunch-Bulemia  -Anxiety/Bipolar disorder-no medications during pregnancy -UTI in pregnancy, treated -LSIL, plan for repeat Pap postpartum -PCN allergy (fever)      no Leaking of Fluid,   no Vaginal Bleeding,   irregular Uterine Contractions,  + Fetal Movement.  ROS: no HA, no epigastric pain, no visual changes.     Prenatal Transfer Tool  Maternal Diabetes: No Genetic Screening: Normal Maternal Ultrasounds/Referrals: Normal Fetal Ultrasounds or other Referrals:  None Maternal Substance Abuse:  No Significant Maternal Medications:  None Significant Maternal Lab Results: Lab values include: Group B Strep positive   PNL:  GBS positive, resistant to , Rub Immune, Hep B neg, RPR NR, HIV neg, GC/C neg, glucola:120 CBC: 11.1/33.1, plt: 169 Blood type: A positive  OBHx: FT Primary C-section for non-reassuring heart tones remote from delivery, 9#2oz, female  PMHx:  Bulimia, Bipolar disorder  Meds:  PNV  Allergy:   Allergies  Allergen Reactions  . Penicillins Other (See Comments)    Unknown reaction. Can  Tolerate Amoxicillin fine.   SurgHx: primary C-section SocHx:   no Tobacco, no  EtOH, no Illicit Drugs  O: Ht 5\' 2"  (1.575 m)  Wt 78.926 kg (174 lb)  BMI 31.82 kg/m2  LMP 03/29/2013 Physical exam performed in office (12/24/13) Gen. AAOx3, NAD Abd. Gravid,  Non-tender, vertex on exam Extr.  Minimal non-pitting edema FHT: 140 bpm by doppler   Last US on 6/5: vertex/anterior placenta/ AFI within normal limits  A/P:  28 y.o. G2P1001 @ 5267w1d EGA who presents for scheduled repeat C-section -FWB:  Reassuring -NPO -SCDs to OR -Gent/Clinda to OR -Risk, benefit and indications explained to patient in office including risk of bleeding, infection and injury- either maternal or fetal.   Pt aware of above and wishes to proceed with repeat C-section.  Virginia HidalgoJennifer Cherine Drumgoole, DO [phone] (pager) [phone] (office)

## 2014-01-03 ENCOUNTER — Inpatient Hospital Stay (HOSPITAL_COMMUNITY): Payer: BC Managed Care – PPO | Admitting: Anesthesiology

## 2014-01-03 ENCOUNTER — Encounter (HOSPITAL_COMMUNITY): Payer: Self-pay

## 2014-01-03 ENCOUNTER — Encounter (HOSPITAL_COMMUNITY): Payer: BC Managed Care – PPO | Admitting: Anesthesiology

## 2014-01-03 ENCOUNTER — Inpatient Hospital Stay (HOSPITAL_COMMUNITY)
Admission: RE | Admit: 2014-01-03 | Discharge: 2014-01-05 | DRG: 765 | Disposition: A | Discharge status: Home / Self Care | Payer: BC Managed Care – PPO | Source: Ambulatory Visit | Attending: Obstetrics & Gynecology | Admitting: Obstetrics & Gynecology

## 2014-01-03 ENCOUNTER — Encounter (HOSPITAL_COMMUNITY)
Admission: RE | Disposition: A | Discharge status: Home / Self Care | Payer: Self-pay | Source: Ambulatory Visit | Attending: Obstetrics & Gynecology

## 2014-01-03 DIAGNOSIS — Z3490 Encounter for supervision of normal pregnancy, unspecified, unspecified trimester: Secondary | ICD-10-CM

## 2014-01-03 DIAGNOSIS — F411 Generalized anxiety disorder: Secondary | ICD-10-CM | POA: Diagnosis present

## 2014-01-03 DIAGNOSIS — O99344 Other mental disorders complicating childbirth: Secondary | ICD-10-CM | POA: Diagnosis present

## 2014-01-03 DIAGNOSIS — Z22330 Carrier of Group B streptococcus: Secondary | ICD-10-CM

## 2014-01-03 DIAGNOSIS — R632 Polyphagia: Secondary | ICD-10-CM | POA: Diagnosis present

## 2014-01-03 DIAGNOSIS — O9989 Other specified diseases and conditions complicating pregnancy, childbirth and the puerperium: Secondary | ICD-10-CM

## 2014-01-03 DIAGNOSIS — O34219 Maternal care for unspecified type scar from previous cesarean delivery: Principal | ICD-10-CM | POA: Diagnosis present

## 2014-01-03 DIAGNOSIS — N390 Urinary tract infection, site not specified: Secondary | ICD-10-CM | POA: Diagnosis present

## 2014-01-03 DIAGNOSIS — O2390 Unspecified genitourinary tract infection in pregnancy, unspecified trimester: Secondary | ICD-10-CM | POA: Diagnosis present

## 2014-01-03 DIAGNOSIS — Z880 Allergy status to penicillin: Secondary | ICD-10-CM

## 2014-01-03 DIAGNOSIS — O99892 Other specified diseases and conditions complicating childbirth: Secondary | ICD-10-CM | POA: Diagnosis present

## 2014-01-03 SURGERY — Surgical Case
Anesthesia: Spinal | Site: Abdomen

## 2014-01-03 MED ORDER — IBUPROFEN 600 MG PO TABS
600.0000 mg | ORAL_TABLET | Freq: Four times a day (QID) | ORAL | Status: DC | PRN
  Administered 2014-01-04: 600 mg via ORAL

## 2014-01-03 MED ORDER — WITCH HAZEL-GLYCERIN EX PADS: 1.0000 "application " | MEDICATED_PAD | CUTANEOUS | Status: DC | PRN

## 2014-01-03 MED ORDER — LACTATED RINGERS IV SOLN
INTRAVENOUS | Status: DC
  Administered 2014-01-04: via INTRAVENOUS

## 2014-01-03 MED ORDER — ONDANSETRON HCL 4 MG/2ML IJ SOLN
INTRAMUSCULAR | Status: AC
  Filled 2014-01-03: qty 2

## 2014-01-03 MED ORDER — SENNOSIDES-DOCUSATE SODIUM 8.6-50 MG PO TABS
2.0000 | ORAL_TABLET | ORAL | Status: DC
  Administered 2014-01-04 – 2014-01-05 (×2): 2 via ORAL
  Filled 2014-01-03 (×2): qty 2

## 2014-01-03 MED ORDER — OXYTOCIN 40 UNITS IN LACTATED RINGERS INFUSION - SIMPLE MED: 62.5000 mL/h | INTRAVENOUS | Status: AC

## 2014-01-03 MED ORDER — MORPHINE SULFATE (PF) 0.5 MG/ML IJ SOLN
INTRAMUSCULAR | Status: DC | PRN
  Administered 2014-01-03: .15 mg via INTRATHECAL

## 2014-01-03 MED ORDER — DIBUCAINE 1 % RE OINT: 1.0000 "application " | TOPICAL_OINTMENT | RECTAL | Status: DC | PRN

## 2014-01-03 MED ORDER — ZOLPIDEM TARTRATE 5 MG PO TABS: 5.0000 mg | ORAL_TABLET | Freq: Every evening | ORAL | Status: DC | PRN

## 2014-01-03 MED ORDER — NALBUPHINE HCL 10 MG/ML IJ SOLN
5.0000 mg | INTRAMUSCULAR | Status: DC | PRN
  Administered 2014-01-03: 10 mg via INTRAVENOUS
  Filled 2014-01-03: qty 1

## 2014-01-03 MED ORDER — IBUPROFEN 600 MG PO TABS
600.0000 mg | ORAL_TABLET | Freq: Four times a day (QID) | ORAL | Status: DC
  Administered 2014-01-04 – 2014-01-05 (×6): 600 mg via ORAL
  Filled 2014-01-03 (×8): qty 1

## 2014-01-03 MED ORDER — KETOROLAC TROMETHAMINE 30 MG/ML IJ SOLN
30.0000 mg | Freq: Four times a day (QID) | INTRAMUSCULAR | Status: AC | PRN
  Administered 2014-01-03 (×2): 30 mg via INTRAVENOUS
  Filled 2014-01-03: qty 1

## 2014-01-03 MED ORDER — KETOROLAC TROMETHAMINE 30 MG/ML IJ SOLN: 30.0000 mg | Freq: Four times a day (QID) | INTRAMUSCULAR | Status: AC | PRN

## 2014-01-03 MED ORDER — LACTATED RINGERS IV SOLN
INTRAVENOUS | Status: DC
  Administered 2014-01-03 (×3): via INTRAVENOUS

## 2014-01-03 MED ORDER — PHENYLEPHRINE 8 MG IN D5W 100 ML (0.08MG/ML) PREMIX OPTIME
INJECTION | INTRAVENOUS | Status: AC
  Filled 2014-01-03: qty 100

## 2014-01-03 MED ORDER — SCOPOLAMINE 1 MG/3DAYS TD PT72
MEDICATED_PATCH | TRANSDERMAL | Status: AC
  Filled 2014-01-03: qty 1

## 2014-01-03 MED ORDER — LACTATED RINGERS IV SOLN
40.0000 [IU] | INTRAVENOUS | Status: DC | PRN
  Administered 2014-01-03: 40 [IU] via INTRAVENOUS

## 2014-01-03 MED ORDER — OXYCODONE-ACETAMINOPHEN 5-325 MG PO TABS
1.0000 | ORAL_TABLET | ORAL | Status: DC | PRN
  Administered 2014-01-04 (×2): 2 via ORAL
  Administered 2014-01-04 (×3): 1 via ORAL
  Administered 2014-01-05: 2 via ORAL
  Administered 2014-01-05 (×2): 1 via ORAL
  Filled 2014-01-03: qty 2
  Filled 2014-01-03 (×4): qty 1
  Filled 2014-01-03: qty 2
  Filled 2014-01-03: qty 1
  Filled 2014-01-03: qty 2
  Filled 2014-01-03: qty 1

## 2014-01-03 MED ORDER — METOCLOPRAMIDE HCL 5 MG/ML IJ SOLN: 10.0000 mg | Freq: Three times a day (TID) | INTRAMUSCULAR | Status: DC | PRN

## 2014-01-03 MED ORDER — SODIUM CHLORIDE 0.9 % IJ SOLN: 3.0000 mL | INTRAMUSCULAR | Status: DC | PRN

## 2014-01-03 MED ORDER — MIDAZOLAM HCL 2 MG/2ML IJ SOLN: 0.5000 mg | Freq: Once | INTRAMUSCULAR | Status: DC | PRN

## 2014-01-03 MED ORDER — LANOLIN HYDROUS EX OINT: 1.0000 "application " | TOPICAL_OINTMENT | CUTANEOUS | Status: DC | PRN

## 2014-01-03 MED ORDER — DIPHENHYDRAMINE HCL 50 MG/ML IJ SOLN: 25.0000 mg | INTRAMUSCULAR | Status: DC | PRN

## 2014-01-03 MED ORDER — ONDANSETRON HCL 4 MG/2ML IJ SOLN: 4.0000 mg | Freq: Three times a day (TID) | INTRAMUSCULAR | Status: DC | PRN

## 2014-01-03 MED ORDER — ONDANSETRON HCL 4 MG PO TABS: 4.0000 mg | ORAL_TABLET | ORAL | Status: DC | PRN

## 2014-01-03 MED ORDER — SIMETHICONE 80 MG PO CHEW
80.0000 mg | CHEWABLE_TABLET | Freq: Three times a day (TID) | ORAL | Status: DC
  Administered 2014-01-04 – 2014-01-05 (×4): 80 mg via ORAL
  Filled 2014-01-03 (×5): qty 1

## 2014-01-03 MED ORDER — ONDANSETRON HCL 4 MG/2ML IJ SOLN: 4.0000 mg | INTRAMUSCULAR | Status: DC | PRN

## 2014-01-03 MED ORDER — KETOROLAC TROMETHAMINE 30 MG/ML IJ SOLN
INTRAMUSCULAR | Status: AC
  Administered 2014-01-03: 30 mg via INTRAVENOUS
  Filled 2014-01-03: qty 1

## 2014-01-03 MED ORDER — MEPERIDINE HCL 25 MG/ML IJ SOLN: 6.2500 mg | INTRAMUSCULAR | Status: DC | PRN

## 2014-01-03 MED ORDER — DIPHENHYDRAMINE HCL 25 MG PO CAPS
25.0000 mg | ORAL_CAPSULE | ORAL | Status: DC | PRN
  Administered 2014-01-04: 25 mg via ORAL
  Filled 2014-01-03 (×2): qty 1

## 2014-01-03 MED ORDER — NALOXONE HCL 1 MG/ML IJ SOLN
1.0000 ug/kg/h | INTRAVENOUS | Status: DC | PRN
  Filled 2014-01-03: qty 2

## 2014-01-03 MED ORDER — PHENYLEPHRINE 8 MG IN D5W 100 ML (0.08MG/ML) PREMIX OPTIME
INJECTION | INTRAVENOUS | Status: DC | PRN
  Administered 2014-01-03: 60 ug/min via INTRAVENOUS

## 2014-01-03 MED ORDER — FENTANYL CITRATE 0.05 MG/ML IJ SOLN
25.0000 ug | INTRAMUSCULAR | Status: DC | PRN
  Administered 2014-01-03 (×2): 50 ug via INTRAVENOUS

## 2014-01-03 MED ORDER — LACTATED RINGERS IV SOLN: INTRAVENOUS | Status: DC

## 2014-01-03 MED ORDER — NALOXONE HCL 0.4 MG/ML IJ SOLN: 0.4000 mg | INTRAMUSCULAR | Status: DC | PRN

## 2014-01-03 MED ORDER — SCOPOLAMINE 1 MG/3DAYS TD PT72
1.0000 | MEDICATED_PATCH | Freq: Once | TRANSDERMAL | Status: DC
  Administered 2014-01-03: 1.5 mg via TRANSDERMAL

## 2014-01-03 MED ORDER — SIMETHICONE 80 MG PO CHEW
80.0000 mg | CHEWABLE_TABLET | ORAL | Status: DC | PRN
  Filled 2014-01-03: qty 1

## 2014-01-03 MED ORDER — OXYTOCIN 10 UNIT/ML IJ SOLN
INTRAMUSCULAR | Status: AC
  Filled 2014-01-03: qty 4

## 2014-01-03 MED ORDER — PROMETHAZINE HCL 25 MG/ML IJ SOLN: 6.2500 mg | INTRAMUSCULAR | Status: DC | PRN

## 2014-01-03 MED ORDER — FENTANYL CITRATE 0.05 MG/ML IJ SOLN
INTRAMUSCULAR | Status: AC
  Filled 2014-01-03: qty 2

## 2014-01-03 MED ORDER — PRENATAL MULTIVITAMIN CH
1.0000 | ORAL_TABLET | Freq: Every day | ORAL | Status: DC
  Administered 2014-01-04: 1 via ORAL
  Filled 2014-01-03 (×2): qty 1

## 2014-01-03 MED ORDER — NALBUPHINE HCL 10 MG/ML IJ SOLN
5.0000 mg | INTRAMUSCULAR | Status: DC | PRN
  Filled 2014-01-03: qty 1

## 2014-01-03 MED ORDER — DIPHENHYDRAMINE HCL 25 MG PO CAPS: 25.0000 mg | ORAL_CAPSULE | Freq: Four times a day (QID) | ORAL | Status: DC | PRN

## 2014-01-03 MED ORDER — SCOPOLAMINE 1 MG/3DAYS TD PT72: 1.0000 | MEDICATED_PATCH | Freq: Once | TRANSDERMAL | Status: DC

## 2014-01-03 MED ORDER — SIMETHICONE 80 MG PO CHEW
80.0000 mg | CHEWABLE_TABLET | ORAL | Status: DC
  Administered 2014-01-04: 80 mg via ORAL
  Filled 2014-01-03 (×2): qty 1

## 2014-01-03 MED ORDER — MENTHOL 3 MG MT LOZG: 1.0000 | LOZENGE | OROMUCOSAL | Status: DC | PRN

## 2014-01-03 MED ORDER — DIPHENHYDRAMINE HCL 50 MG/ML IJ SOLN: 12.5000 mg | INTRAMUSCULAR | Status: DC | PRN

## 2014-01-03 MED ORDER — FENTANYL CITRATE 0.05 MG/ML IJ SOLN
INTRAMUSCULAR | Status: AC
  Administered 2014-01-03: 50 ug via INTRAVENOUS
  Filled 2014-01-03: qty 2

## 2014-01-03 MED ORDER — ONDANSETRON HCL 4 MG/2ML IJ SOLN
INTRAMUSCULAR | Status: DC | PRN
  Administered 2014-01-03: 4 mg via INTRAVENOUS

## 2014-01-03 MED ORDER — MORPHINE SULFATE 0.5 MG/ML IJ SOLN
INTRAMUSCULAR | Status: AC
  Filled 2014-01-03: qty 10

## 2014-01-03 MED ORDER — ACETAMINOPHEN 500 MG PO TABS
1000.0000 mg | ORAL_TABLET | Freq: Four times a day (QID) | ORAL | Status: AC
  Filled 2014-01-03: qty 2

## 2014-01-03 MED ORDER — FENTANYL CITRATE 0.05 MG/ML IJ SOLN
INTRAMUSCULAR | Status: DC | PRN
Start: 2014-01-03 — End: 2014-01-03
  Administered 2014-01-03: 50 ug via INTRAVENOUS
  Administered 2014-01-03: 25 ug via INTRATHECAL
  Administered 2014-01-03: 25 ug via INTRAVENOUS

## 2014-01-03 MED ORDER — FLUOXETINE HCL 20 MG PO CAPS
20.0000 mg | ORAL_CAPSULE | Freq: Every day | ORAL | Status: DC
  Administered 2014-01-04 – 2014-01-05 (×2): 20 mg via ORAL
  Filled 2014-01-03 (×2): qty 1

## 2014-01-03 SURGICAL SUPPLY — 37 items
BENZOIN TINCTURE PRP APPL 2/3 (GAUZE/BANDAGES/DRESSINGS) ×3 IMPLANT
CLAMP CORD UMBIL (MISCELLANEOUS) IMPLANT
CLOSURE WOUND 1/2 X4 (GAUZE/BANDAGES/DRESSINGS) ×1
CLOTH BEACON ORANGE TIMEOUT ST (SAFETY) ×3 IMPLANT
DERMABOND ADVANCED (GAUZE/BANDAGES/DRESSINGS)
DERMABOND ADVANCED .7 DNX12 (GAUZE/BANDAGES/DRESSINGS) IMPLANT
DRAPE LG THREE QUARTER DISP (DRAPES) IMPLANT
DRSG OPSITE POSTOP 4X10 (GAUZE/BANDAGES/DRESSINGS) ×3 IMPLANT
DURAPREP 26ML APPLICATOR (WOUND CARE) ×3 IMPLANT
ELECT REM PT RETURN 9FT ADLT (ELECTROSURGICAL) ×3
ELECTRODE REM PT RTRN 9FT ADLT (ELECTROSURGICAL) ×1 IMPLANT
EXTRACTOR VACUUM KIWI (MISCELLANEOUS) IMPLANT
GLOVE BIOGEL PI IND STRL 6.5 (GLOVE) ×1 IMPLANT
GLOVE BIOGEL PI INDICATOR 6.5 (GLOVE) ×2
GLOVE ECLIPSE 6.5 STRL STRAW (GLOVE) ×3 IMPLANT
GOWN STRL REUS W/TWL LRG LVL3 (GOWN DISPOSABLE) ×6 IMPLANT
HEMOSTAT SURGICEL 2X14 (HEMOSTASIS) ×3 IMPLANT
KIT ABG SYR 3ML LUER SLIP (SYRINGE) IMPLANT
NEEDLE HYPO 25X5/8 SAFETYGLIDE (NEEDLE) IMPLANT
NS IRRIG 1000ML POUR BTL (IV SOLUTION) ×3 IMPLANT
PACK C SECTION WH (CUSTOM PROCEDURE TRAY) ×3 IMPLANT
PAD ABD 7.5X8 STRL (GAUZE/BANDAGES/DRESSINGS) ×3 IMPLANT
PAD OB MATERNITY 4.3X12.25 (PERSONAL CARE ITEMS) ×3 IMPLANT
RTRCTR C-SECT PINK 25CM LRG (MISCELLANEOUS) ×3 IMPLANT
SPONGE LAP 18X18 X RAY DECT (DISPOSABLE) ×3 IMPLANT
STRIP CLOSURE SKIN 1/2X4 (GAUZE/BANDAGES/DRESSINGS) ×2 IMPLANT
SUT PLAIN 0 NONE (SUTURE) IMPLANT
SUT PLAIN 2 0 XLH (SUTURE) IMPLANT
SUT VIC AB 0 CT1 27 (SUTURE) ×4
SUT VIC AB 0 CT1 27XBRD ANBCTR (SUTURE) ×2 IMPLANT
SUT VIC AB 0 CTX 36 (SUTURE) ×6
SUT VIC AB 0 CTX36XBRD ANBCTRL (SUTURE) ×3 IMPLANT
SUT VIC AB 2-0 CT1 (SUTURE) ×3 IMPLANT
SUT VIC AB 4-0 KS 27 (SUTURE) ×3 IMPLANT
TOWEL OR 17X24 6PK STRL BLUE (TOWEL DISPOSABLE) ×3 IMPLANT
TRAY FOLEY CATH 14FR (SET/KITS/TRAYS/PACK) IMPLANT
WATER STERILE IRR 1000ML POUR (IV SOLUTION) IMPLANT

## 2014-01-03 NOTE — Interval H&P Note (Signed)
History and Physical Interval Note:  01/03/2014 10:45 AM  Virginia Hunter  has presented today for surgery, with the diagnosis of Previous Cesarean Section  The various methods of treatment have been discussed with the patient and family. After consideration of risks, benefits and other options for treatment, the patient has consented to  Procedure(s): Repeat CESAREAN SECTION (N/A) as a surgical intervention .  The patient's history has been reviewed, patient examined, no change in status, stable for surgery.  I have reviewed the patient's chart and labs.  Questions were answered to the patient's satisfaction.     Myna HidalgoZAN, Zahraa Bhargava, M

## 2014-01-03 NOTE — Lactation Note (Signed)
This note was copied from the chart of Virginia Hunter. Lactation Consultation Note  Patient Name: Virginia Leotta Weingarten XIPJA'S Date: [birth year] Reason for consult: Initial assessment of this mom and baby at 10 hours postpartum.  Mom states that she pumped for one year for her 29 yo son but this newborn has been latching well so far.  She still has the Medela pump at home and requests new kit.  Her newborn had initial LATCH score=8 and has breastfed 3 times since delivery.  Mom denies any breastfeeding problems and states that she knows how to hand express her colostrum/milk as needed.  LC encouraged frequent STS and cue feedings.  LC encouraged review of Baby and Me pp 9, 14 and 20-25 for STS and BF information. LC provided [employer] brochure and reviewed [facility] services and list of community and web site resources.    Maternal Data Formula Feeding for Exclusion: No Infant to breast within first hour of birth: Yes (initial LATCH score=8 after delivery) Has patient been taught Hand Expression?: Yes (mom states that she knows how to hand express her colostrum/milk) Does the patient have breastfeeding experience prior to this delivery?: Yes  Feeding Feeding Type: Breast Fed Length of feed: 20 min  LATCH Score/Interventions           initial LATCH score=8 after delivery           Lactation Tools Discussed/Used   STS, hand expression, cue feedings  Consult Status Consult Status: Follow-up Date: 01/04/14 Follow-up type: In-patient    Junious Dresser [facility] 01/03/2014, 9:33 PM

## 2014-01-03 NOTE — Anesthesia Preprocedure Evaluation (Signed)
Anesthesia Evaluation  Patient identified by MRN, date of birth, ID band Patient awake    Reviewed: Allergy & Precautions, H&P , NPO status , Patient's Chart, lab work & pertinent test results  Airway Mallampati: II       Dental   Pulmonary  breath sounds clear to auscultation        Cardiovascular Exercise Tolerance: Good Rhythm:regular Rate:Normal     Neuro/Psych    GI/Hepatic   Endo/Other    Renal/GU      Musculoskeletal   Abdominal   Peds  Hematology   Anesthesia Other Findings   Reproductive/Obstetrics (+) Pregnancy                             Anesthesia Physical Anesthesia Plan  ASA: II  Anesthesia Plan: Spinal   Post-op Pain Management:    Induction:   Airway Management Planned:   Additional Equipment:   Intra-op Plan:   Post-operative Plan:   Informed Consent: I have reviewed the patients History and Physical, chart, labs and discussed the procedure including the risks, benefits and alternatives for the proposed anesthesia with the patient or authorized representative who has indicated his/her understanding and acceptance.     Plan Discussed with: Anesthesiologist, CRNA and Surgeon  Anesthesia Plan Comments:         Anesthesia Quick Evaluation  

## 2014-01-03 NOTE — Anesthesia Postprocedure Evaluation (Signed)
  Anesthesia Post Note  Patient: Virginia Hunter  Procedure(s) Performed: Procedure(s) (LRB): Repeat CESAREAN SECTION (N/A)  Anesthesia type: Spinal  Patient location: PACU  Post pain: Pain level controlled  Post assessment: Post-op Vital signs reviewed  Last Vitals:  Filed Vitals:   01/03/14 1230  BP: 104/77  Pulse: 81  Temp:   Resp: 20    Post vital signs: Reviewed  Level of consciousness: awake  Complications: No apparent anesthesia complications

## 2014-01-03 NOTE — Transfer of Care (Signed)
Immediate Anesthesia Transfer of Care Note  Patient: Virginia Hunter  Procedure(s) Performed: Procedure(s): Repeat CESAREAN SECTION (N/A)  Patient Location: PACU  Anesthesia Type:Spinal  Level of Consciousness: awake  Airway & Oxygen Therapy: Patient Spontanous Breathing  Post-op Assessment: Report given to PACU RN  Post vital signs: Reviewed and stable  Complications: No apparent anesthesia complications

## 2014-01-03 NOTE — Anesthesia Procedure Notes (Signed)

## 2014-01-03 NOTE — Op Note (Signed)
PreOp Diagnosis: Repeat low transverse C-section Bulimia, Bipolar disorder and anxiety, UTI in pregnancy PostOp Diagnosis: same Procedure: Repeat LTCS Surgeon: Dr. Myna HidalgoJennifer Mayia Megill Assistant: Dr. Gerald Leitzara Cole Anesthesia: spinal Complications: none EBL: 800cc UOP: 150cc IVF: 2800cc  INDICATIONS: Bryon LionsKimberly S Polak is a 28 y.o. female presenting for G2P1001@[redacted]w[redacted]d  who presentes for scheduled repeat C-section. Pt has had one prior LTCS and desires repeat C-section.   Findings: Female infant from vertex presentation, APGARs 608,9.   Normal uterus, tubes and ovaries bilaterally.  PROCEDURE:  Informed consent was obtained from the patient with risks, benefits, complications, treatment options, and expected outcomes discussed with the patient.  The patient concurred with the proposed plan, giving informed consent with form signed.   The patient was taken to Operating Room, and identified with the procedure verified as C-Section Delivery with Time Out. With induction of anesthesia, the patient was prepped and draped in the usual sterile fashion. A Pfannenstiel incision was made and carried down through the subcutaneous tissue to the fascia. The fascia was incised in the midline and extended transversely. The superior aspect of the fascial incision was grasped with Kochers elevated and the underlying muscle dissected off. The inferior aspect of the facial incision was in similar fashion, grasped elevated and rectus muscles dissected off. The peritoneum was identified and entered. Peritoneal incision was extended longitudinally. The Alexis retractor was inserted. The utero-vesical peritoneal reflection was identified and incised transversely with the [facility] scissors, the incision extended laterally, the bladder flap created digitally. A low transverse uterine incision was made and the infants head delivered atraumatically. The umbilical cord was clamped, cut and the baby was handed off to awaiting pediatricians.   The placenta was removed intact and appeared normal. The uterine outline, tubes and ovaries appeared normal. The uterine incision was closed with running locked sutures of 0 Vicryl and a second layer of the same stitch was used in an imbricating fashion.  To ensure hemostasis addition 0-vicryl was used in a figure of eight fashion to ensure hemostasis.  Pericolic gutters were then cleared of all clots and debris.  Excellent hemostasis was obtained.  Surigcel was placed over the hysterotomy. The fascia was then reapproximated with running sutures of 0 Vicryl. The skin was closed with 4-0 vicryl in a subcuticular fashion.  Instrument, sponge, and needle counts were correct prior the abdominal closure and at the conclusion of the case. The patient was taken to recovery in stable condition.  Myna HidalgoJennifer Tatem Fesler, DO [phone] (pager) [phone] (office)

## 2014-01-04 ENCOUNTER — Encounter (HOSPITAL_COMMUNITY): Payer: Self-pay | Admitting: Obstetrics & Gynecology

## 2014-01-04 LAB — CBC
HEMATOCRIT: 24.9 % — AB (ref 36.0–46.0)
Hemoglobin: 8.2 g/dL — ABNORMAL LOW (ref 12.0–15.0)
MCH: 30.5 pg (ref 26.0–34.0)
MCHC: 32.9 g/dL (ref 30.0–36.0)
MCV: 92.6 fL (ref 78.0–100.0)
Platelets: 141 10*3/uL — ABNORMAL LOW (ref 150–400)
RBC: 2.69 MIL/uL — ABNORMAL LOW (ref 3.87–5.11)
RDW: 13.6 % (ref 11.5–15.5)
WBC: 8.9 10*3/uL (ref 4.0–10.5)

## 2014-01-04 MED ORDER — HYDROMORPHONE HCL 2 MG PO TABS
1.0000 mg | ORAL_TABLET | Freq: Four times a day (QID) | ORAL | Status: DC | PRN
  Administered 2014-01-04 (×2): 1 mg via ORAL
  Filled 2014-01-04 (×2): qty 1

## 2014-01-04 MED ORDER — FERROUS SULFATE 325 (65 FE) MG PO TABS
325.0000 mg | ORAL_TABLET | Freq: Two times a day (BID) | ORAL | Status: DC
  Administered 2014-01-04 – 2014-01-05 (×3): 325 mg via ORAL
  Filled 2014-01-04 (×3): qty 1

## 2014-01-04 NOTE — Progress Notes (Signed)
Postpartum Note Day # 1  S:  Patient resting comfortable in bed.  Pain controlled.  Tolerating gen diet. + flatus, no BM.  Lochia appropriate- repots some clots last night, but has since resolved.  Ambulating without difficulty.  She denies n/v/f/c, SOB, or CP.  Pt breastfeeding.  Foley removed, voided x 1  O: Temp:  [97.7 F (36.5 C)-99.1 F (37.3 C)] 97.9 F (36.6 C) (06/16 0630) Pulse Rate:  [61-87] 73 (06/16 0630) Resp:  [16-22] 18 (06/16 0630) BP: (90-108)/(39-77) 102/68 mmHg (06/16 0630) SpO2:  [96 %-100 %] 96 % (06/16 0630) Gen: A&Ox3, NAD CV: RRR, no MRG Resp: CTAB Abdomen: soft, NT, ND, +BS, appropriately tender Uterus: firm, below umbilicus Incision: c/d/i, bandage on Ext: No edema, no calf tenderness bilaterally  Labs:  CBC Latest Ref Rng 01/04/2014 12/31/2013 09/30/2012  WBC 4.0 - 10.5 K/uL 8.9 7.2 8.8  Hemoglobin 12.0 - 15.0 g/dL 8.2(L) 11.1(L) 13.8  Hematocrit 36.0 - 46.0 % 24.9(L) 33.1(L) 38.0  Platelets 150 - 400 K/uL 141(L) 169 239    A/P: Pt is a 28 y.o. [id number] s/p repeat C-section, POD#1  - Pain well controlled -GU: UOP is adequate -GI: Tolerating general diet -Activity: encouraged sitting up to chair and ambulation as tolerated -Prophylaxis: early ambulation, SCDs while in bed -Heme: Hgb as above, Asx, VSS, iron bid -Bipolar/Anxiety: restarted on Prozac 20mg  daily, pt very concerned about effects on breastmilk.  Will hold off on restarting Depakote -Declined baby boy circ -Continue routine postpartum/postoperative care  Myna HidalgoJennifer Ozan, DO [phone] (pager) [phone] (office)

## 2014-01-04 NOTE — Anesthesia Postprocedure Evaluation (Signed)
  Anesthesia Post-op Note  Patient: Virginia LionsKimberly S Ramus  Procedure(s) Performed: Procedure(s): Repeat CESAREAN SECTION (N/A)  Patient Location: Mother/Baby  Anesthesia Type:Spinal  Level of Consciousness: awake  Airway and Oxygen Therapy: Patient Spontanous Breathing  Post-op Pain: none  Post-op Assessment: Patient's Cardiovascular Status Stable, Respiratory Function Stable, Patent Airway, No signs of Nausea or vomiting, Adequate PO intake, Pain level controlled, No headache, No backache, No residual numbness and No residual motor weakness  Post-op Vital Signs: Reviewed and stable  Last Vitals:  Filed Vitals:   01/04/14 0630  BP: 102/68  Pulse: 73  Temp: 36.6 C  Resp: 18    Complications: No apparent anesthesia complications

## 2014-01-05 MED ORDER — TETANUS-DIPHTH-ACELL PERTUSSIS 5-2.5-18.5 LF-MCG/0.5 IM SUSP
0.5000 mL | Freq: Once | INTRAMUSCULAR | Status: AC
  Administered 2014-01-05: 0.5 mL via INTRAMUSCULAR

## 2014-01-05 MED ORDER — FERROUS SULFATE 325 (65 FE) MG PO TABS: 325.0000 mg | ORAL_TABLET | Freq: Every day | ORAL | Status: DC

## 2014-01-05 MED ORDER — FLUOXETINE HCL 20 MG PO CAPS: 20.0000 mg | ORAL_CAPSULE | Freq: Every day | ORAL | Status: DC

## 2014-01-05 MED ORDER — OXYCODONE-ACETAMINOPHEN 5-325 MG PO TABS: 1.0000 | ORAL_TABLET | Freq: Four times a day (QID) | ORAL | Status: DC | PRN

## 2014-01-05 MED ORDER — IBUPROFEN 600 MG PO TABS: 600.0000 mg | ORAL_TABLET | Freq: Four times a day (QID) | ORAL | Status: DC | PRN

## 2014-01-05 MED ORDER — SENNOSIDES-DOCUSATE SODIUM 8.6-50 MG PO TABS: 2.0000 | ORAL_TABLET | Freq: Every evening | ORAL | Status: DC | PRN

## 2014-01-05 NOTE — Progress Notes (Signed)
Postpartum Note Day # 2  S:  Patient resting comfortable in bed.  Pain controlled.  Tolerating gen diet. + flatus, no BM.  Lochia minimal  Ambulating without difficulty.  She denies n/v/f/c, SOB, or CP.  Pt breastfeeding.  Voiding freely.  No acute complaints.  O: Temp:  [97.9 F (36.6 C)-98.3 F (36.8 C)] 98.2 F (36.8 C) (06/17 0521) Pulse Rate:  [75-87] 82 (06/17 0521) Resp:  [18-20] 19 (06/17 0521) BP: (97-109)/(52-69) 109/52 mmHg (06/17 0521) SpO2:  [95 %-100 %] 100 % (06/17 0521) Gen: A&Ox3, NAD CV: RRR, +systolic murmur Resp: CTAB Abdomen: soft, NT, ND, +BS, appropriately tender Uterus: firm, below umbilicus Incision: c/d/i, bandage on Ext: No edema, no calf tenderness bilaterally  Labs:  CBC Latest Ref Rng 01/04/2014 12/31/2013 09/30/2012  WBC 4.0 - 10.5 K/uL 8.9 7.2 8.8  Hemoglobin 12.0 - 15.0 g/dL 8.2(L) 11.1(L) 13.8  Hematocrit 36.0 - 46.0 % 24.9(L) 33.1(L) 38.0  Platelets 150 - 400 K/uL 141(L) 169 239    A/P: Pt is a 28 y.o. [id number] s/p repeat C-section, POD#2  - Pain well controlled -GU: voiding freely -GI: Tolerating general diet -Activity: encouraged sitting up to chair and ambulation as tolerated -Prophylaxis: early ambulation, SCDs while in bed -Heme: Hgb as above, Asx, VSS, iron bid -Bipolar/Anxiety: restarted on Prozac 20mg  daily -Continue routine postpartum/postoperative care  Patient considering discharge today, pending discharge of the baby.  Myna HidalgoJennifer Ozan, DO [phone] (pager) [phone] (office)

## 2014-01-05 NOTE — Lactation Note (Signed)
This note was copied from the chart of Boy Georgina PillionKimberly Loureiro. Lactation Consultation Note   Follow up consult with this mom and baby, now 3946 hours old, and term. Mom fed the baby 12 mls of formula  Because"he was tearing up my nipples and I did noto have enough for him to eat" She refused to let me do hand expression. She was sleeping with her baby in her arms, also sleeping. I explained that he was probably nipple sucking(mom has large, elongated nipples), and I advised mom to call when baby latched next, for help with a deeper latch. She agreed to this.  Patient Name: Boy Georgina PillionKimberly Leverett ZOXWR'UToday's Date: 01/05/2014 Reason for consult: Follow-up assessment   Maternal Data    Feeding Feeding Type: Breast Fed Length of feed: 10 min  LATCH Score/Interventions Latch: Grasps breast easily, tongue down, lips flanged, rhythmical sucking.  Audible Swallowing: A few with stimulation  Type of Nipple: Everted at rest and after stimulation  Comfort (Breast/Nipple): Soft / non-tender     Hold (Positioning): No assistance needed to correctly position infant at breast.  LATCH Score: 9  Lactation Tools Discussed/Used     Consult Status Consult Status: Follow-up Date: 01/05/14 Follow-up type: In-patient    Alfred LevinsLee, Christine Anne 01/05/2014, 9:38 AM

## 2014-01-05 NOTE — Discharge Instructions (Signed)
Cesarean Delivery Care After Refer to this sheet in the next few weeks. These instructions provide you with information on caring for yourself after your procedure. Your health care provider may also give you specific instructions. Your treatment has been planned according to current medical practices, but problems sometimes occur. Call your health care provider if you have any problems or questions after you go home. HOME CARE INSTRUCTIONS  Only take over-the-counter or prescription medications as directed by your health care provider.  For severe pain: Percocet as needed or Motrin  Percocet may cause constipation, please use either Sennokot or over the counter Colace as needed  Do not drink alcohol, especially if you are breastfeeding or taking medication to relieve pain.  Do not chew or smoke tobacco.  Continue to use good perineal care. Good perineal care includes:  Wiping your perineum from front to back.  Keeping your perineum clean.  Check your surgical cut (incision) daily for increased redness, drainage, swelling, or separation of skin.  Clean your incision gently with soap and water every day, and then pat it dry. If your health care provider says it is OK, leave the incision uncovered. Use a bandage (dressing) if the incision is draining fluid or appears irritated. If the adhesive strips across the incision do not fall off within 7 days, carefully peel them off.  Hug a pillow when coughing or sneezing until your incision is healed. This helps to relieve pain.  Do not use tampons or douche until your health care provider says it is okay.  Shower, wash your hair, and take tub baths as directed by your health care provider.  Wear a well-fitting bra that provides breast support.  Limit wearing support panties or control-top hose.  Drink enough fluids to keep your urine clear or pale yellow.  Eat high-fiber foods such as whole grain cereals and breads, brown rice, beans,  and fresh fruits and vegetables every day. These foods may help prevent or relieve constipation.  Resume activities such as climbing stairs, driving, lifting, exercising, or traveling as directed by your health care provider.  Talk to your health care provider about resuming sexual activities. This is dependent upon your risk of infection, your rate of healing, and your comfort and desire to resume sexual activity.  Try to have someone help you with your household activities and your newborn for at least a few days after you leave the hospital.  Rest as much as possible. Try to rest or take a nap when your newborn is sleeping.  Increase your activities gradually.  Keep all of your scheduled postpartum appointments. It is very important to keep your scheduled follow-up appointments. At these appointments, your health care provider will be checking to make sure that you are healing physically and emotionally. SEEK MEDICAL CARE IF:   You are passing large clots from your vagina. Save any clots to show your health care provider.  You have a foul smelling discharge from your vagina.  You have trouble urinating.  You are urinating frequently.  You have pain when you urinate.  You have a change in your bowel movements.  You have increasing redness, pain, or swelling near your incision.  You have pus draining from your incision.  Your incision is separating.  You have painful, hard, or reddened breasts.  You have a severe headache.  You have blurred vision or see spots.  You feel sad or depressed.  You have thoughts of hurting yourself or your newborn.  You  have questions about your care, the care of your newborn, or medications.  You are dizzy or lightheaded.  You have a rash.  You have pain, redness, or swelling at the site of the removed intravenous access (IV) tube.  You have nausea or vomiting.  You stopped breastfeeding and have not had a menstrual period within 12  weeks of stopping.  You are not breastfeeding and have not had a menstrual period within 12 weeks of delivery.  You have a fever. SEEK IMMEDIATE MEDICAL CARE IF:  You have persistent pain.  You have chest pain.  You have shortness of breath.  You faint.  You have leg pain.  You have stomach pain.  Your vaginal bleeding saturates 2 or more sanitary pads in 1 hour. MAKE SURE YOU:   Understand these instructions.  Will watch your condition.  Will get help right away if you are not doing well or get worse. Document Released: 03/30/2002 Document Revised: 03/10/2013 Document Reviewed: 03/04/2012 [facility] Patient Information 2015 [employer] [place]. This information is not intended to replace advice given to you by your health care provider. Make sure you discuss any questions you have with your health care provider.

## 2014-01-05 NOTE — Progress Notes (Signed)
Pt discharged before CSW could assess history of bipolar disorder.

## 2014-01-07 NOTE — Discharge Summary (Signed)
Obstetric Discharge Summary Reason for Admission: cesarean section Date of Admission: 01/03/14 Date of Discharge: 01/05/14 Prenatal Procedures: none Intrapartum Procedures: cesarean: low cervical, transverse Postpartum Procedures: none Complications-Operative and Postpartum: none Hemoglobin  Date Value Ref Range Status  01/04/2014 8.2* 12.0 - 15.0 g/dL Final     HCT  Date Value Ref Range Status  01/04/2014 24.9* 36.0 - 46.0 % Final    Physical Exam:  General: alert, cooperative and no distress Lochia: appropriate Uterine Fundus: firm, non-tender, below umbilicus Incision: healing well, no significant drainage, clean/dry/intact with honeycomb dressing DVT Evaluation: No evidence of DVT seen on physical exam.  Hospital Course:  Virginia Hunter is a 28 y.o. female presenting for G2P1001_0  who presentes for scheduled repeat C-section. Pt has had one prior LTCS and desires repeat C-section. Pregnancy complicated by:  Barb Merino  -Anxiety/Bipolar disorder-no medications during pregnancy  -UTI in pregnancy, treated  -LSIL, plan for repeat Pap postpartum  -PCN allergy (fever)   For information regarding the delivery, please see the operative report.  Female infant from vertex presentation, APGARs 62,9. Normal uterus, tubes and ovaries bilaterally.  The patient's postoperative course was uncomplicated.  Pt restarted on Prozac for Depression.  Patient met all postoperative milestones appropriately and was discharged home in stable condition on POD#2.   Discharge Diagnoses: Term Pregnancy-delivered  Discharge Information: Date: 01/07/2014 Activity: pelvic rest Diet: routine Medications: PNV, Ibuprofen, Colace, Iron, Percocet and Prozac Condition: stable Instructions: refer to practice specific booklet Discharge to: home Follow-up Information   Follow up with Virginia Hunter, M, DO In 1 week.   Specialty:  Obstetrics and Gynecology   Contact information:   [address] [place] [address] [phone]       Newborn Data: Live born female  Birth Weight: 7 lb 5.3 oz (3325 g) APGAR: 8, 9  Home with mother.  Virginia Hunter, Virginia Hunter 01/07/2014, 2:33 PM

## 2014-01-21 ENCOUNTER — Telehealth: Payer: Self-pay | Admitting: Certified Nurse Midwife

## 2014-01-21 NOTE — Telephone Encounter (Signed)
Pt called c/o pain and lump in the right breast, fever max of 101.0, nausea and lack of appetite, SP repeat CS on 01/03/14.  States she had mastitis in this breast with her last baby.  Rx of Kelfex 500mg  q6 x7 days [facility] [address], [place].  Pt advised to call in symptoms don't improve.  VS CNM

## 2014-03-03 ENCOUNTER — Other Ambulatory Visit: Payer: Self-pay | Admitting: Obstetrics & Gynecology

## 2014-03-03 ENCOUNTER — Other Ambulatory Visit (HOSPITAL_COMMUNITY)
Admission: RE | Admit: 2014-03-03 | Discharge: 2014-03-03 | Disposition: A | Discharge status: Home / Self Care | Payer: BC Managed Care – PPO | Source: Ambulatory Visit | Attending: Obstetrics & Gynecology | Admitting: Obstetrics & Gynecology

## 2014-03-03 DIAGNOSIS — Z01419 Encounter for gynecological examination (general) (routine) without abnormal findings: Secondary | ICD-10-CM | POA: Diagnosis not present

## 2014-03-08 LAB — CYTOLOGY - PAP

## 2014-05-23 ENCOUNTER — Encounter (HOSPITAL_COMMUNITY): Payer: Self-pay | Admitting: Obstetrics & Gynecology

## 2014-08-01 ENCOUNTER — Encounter (HOSPITAL_COMMUNITY): Payer: Self-pay | Admitting: Emergency Medicine

## 2014-08-01 ENCOUNTER — Emergency Department (HOSPITAL_COMMUNITY)
Admission: EM | Admit: 2014-08-01 | Discharge: 2014-08-02 | Disposition: A | Discharge status: Home / Self Care | Payer: Self-pay | Attending: Emergency Medicine | Admitting: Emergency Medicine

## 2014-08-01 DIAGNOSIS — Z79899 Other long term (current) drug therapy: Secondary | ICD-10-CM | POA: Insufficient documentation

## 2014-08-01 DIAGNOSIS — F3130 Bipolar disorder, current episode depressed, mild or moderate severity, unspecified: Secondary | ICD-10-CM | POA: Insufficient documentation

## 2014-08-01 DIAGNOSIS — Z880 Allergy status to penicillin: Secondary | ICD-10-CM | POA: Insufficient documentation

## 2014-08-01 LAB — BASIC METABOLIC PANEL
Anion gap: 6 (ref 5–15)
BUN: 16 mg/dL (ref 6–23)
CO2: 27 mmol/L (ref 19–32)
Calcium: 8.3 mg/dL — ABNORMAL LOW (ref 8.4–10.5)
Chloride: 107 mEq/L (ref 96–112)
Creatinine, Ser: 0.43 mg/dL — ABNORMAL LOW (ref 0.50–1.10)
GFR calc Af Amer: >90 mL/min (ref >90)
GFR calc non Af Amer: >90 mL/min (ref >90)
Glucose, Bld: 77 mg/dL (ref 70–99)
Potassium: 3.5 mmol/L (ref 3.5–5.1)
Sodium: 140 mmol/L (ref 135–145)

## 2014-08-01 LAB — CBC WITH DIFFERENTIAL/PLATELET
Basophils Absolute: 0 10*3/uL (ref 0.0–0.1)
Basophils Relative: 1 % (ref 0–1)
Eosinophils Absolute: 0.2 10*3/uL (ref 0.0–0.7)
Eosinophils Relative: 4 % (ref 0–5)
HCT: 33.2 % — ABNORMAL LOW (ref 36.0–46.0)
Hemoglobin: 11.5 g/dL — ABNORMAL LOW (ref 12.0–15.0)
Lymphocytes Relative: 40 % (ref 12–46)
Lymphs Abs: 2.3 10*3/uL (ref 0.7–4.0)
MCH: 34 pg (ref 26.0–34.0)
MCHC: 34.6 g/dL (ref 30.0–36.0)
MCV: 98.2 fL (ref 78.0–100.0)
Monocytes Absolute: 0.8 10*3/uL (ref 0.1–1.0)
Monocytes Relative: 14 % — ABNORMAL HIGH (ref 3–12)
Neutro Abs: 2.4 10*3/uL (ref 1.7–7.7)
Neutrophils Relative %: 43 % (ref 43–77)
Platelets: 216 10*3/uL (ref 150–400)
RBC: 3.38 MIL/uL — ABNORMAL LOW (ref 3.87–5.11)
RDW: 13.2 % (ref 11.5–15.5)
WBC: 5.7 10*3/uL (ref 4.0–10.5)

## 2014-08-01 LAB — ETHANOL: Alcohol, Ethyl (B): <5 mg/dL (ref 0–9)

## 2014-08-01 LAB — VALPROIC ACID LEVEL: Valproic Acid Lvl: <10 ug/mL — ABNORMAL LOW (ref 50.0–100.0)

## 2014-08-01 LAB — SALICYLATE LEVEL

## 2014-08-01 LAB — ACETAMINOPHEN LEVEL: ACETAMINOPHEN (TYLENOL), SERUM: 28.2 ug/mL (ref 10–30)
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
## 2014-08-01 NOTE — ED Notes (Signed)
Pt states that she is at a cross roads between ativan and klonopin bc she is trying to breastfeed. Pt states that when she has anxiety "it comes on and hits very hard".
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
## 2014-08-01 NOTE — ED Notes (Signed)
Probation officer, Earney MalletKim Shearon: [phone]  Please call if pt decides to leave. Pt is here voluntarily at this time, but probation officer states that she will take out IVC paperwork on pt if pt decides to leave.

## 2014-08-01 NOTE — ED Notes (Signed)
Pt has 2 bags of belongings in locker 30; 1 bag at nurses station

## 2014-08-02 ENCOUNTER — Encounter (HOSPITAL_COMMUNITY): Payer: Self-pay

## 2014-08-02 ENCOUNTER — Inpatient Hospital Stay (HOSPITAL_COMMUNITY)
Admission: EM | Admit: 2014-08-02 | Discharge: 2014-08-03 | DRG: 885 | Disposition: A | Discharge status: Home / Self Care | Payer: Federal, State, Local not specified - Other | Source: Intra-hospital | Attending: Psychiatry | Admitting: Psychiatry

## 2014-08-02 DIAGNOSIS — F410 Panic disorder [episodic paroxysmal anxiety] without agoraphobia: Secondary | ICD-10-CM | POA: Diagnosis present

## 2014-08-02 DIAGNOSIS — E611 Iron deficiency: Secondary | ICD-10-CM | POA: Diagnosis present

## 2014-08-02 DIAGNOSIS — Z599 Problem related to housing and economic circumstances, unspecified: Secondary | ICD-10-CM | POA: Diagnosis not present

## 2014-08-02 DIAGNOSIS — Z79891 Long term (current) use of opiate analgesic: Secondary | ICD-10-CM | POA: Diagnosis not present

## 2014-08-02 DIAGNOSIS — F4310 Post-traumatic stress disorder, unspecified: Secondary | ICD-10-CM | POA: Diagnosis present

## 2014-08-02 DIAGNOSIS — Z791 Long term (current) use of non-steroidal anti-inflammatories (NSAID): Secondary | ICD-10-CM | POA: Diagnosis not present

## 2014-08-02 DIAGNOSIS — F319 Bipolar disorder, unspecified: Secondary | ICD-10-CM

## 2014-08-02 DIAGNOSIS — J449 Chronic obstructive pulmonary disease, unspecified: Secondary | ICD-10-CM | POA: Diagnosis present

## 2014-08-02 DIAGNOSIS — F312 Bipolar disorder, current episode manic severe with psychotic features: Principal | ICD-10-CM | POA: Diagnosis present

## 2014-08-02 DIAGNOSIS — G4700 Insomnia, unspecified: Secondary | ICD-10-CM | POA: Diagnosis present

## 2014-08-02 DIAGNOSIS — F1994 Other psychoactive substance use, unspecified with psychoactive substance-induced mood disorder: Secondary | ICD-10-CM | POA: Insufficient documentation

## 2014-08-02 LAB — LIPID PANEL
CHOLESTEROL: 173 mg/dL (ref 0–200)
HDL: 58 mg/dL (ref >39)
LDL Cholesterol: 95 mg/dL (ref 0–99)
Total CHOL/HDL Ratio: 3 RATIO
Triglycerides: 99 mg/dL (ref <150)
VLDL: 20 mg/dL (ref 0–40)

## 2014-08-02 LAB — HEMOGLOBIN A1C
Hgb A1c MFr Bld: 4.9 % (ref <5.7)
Mean Plasma Glucose: 94 mg/dL (ref <117)

## 2014-08-02 LAB — TSH: TSH: 0.755 u[IU]/mL (ref 0.350–4.500)

## 2014-08-02 MED ORDER — ALBUTEROL SULFATE HFA 108 (90 BASE) MCG/ACT IN AERS: 1.0000 | INHALATION_SPRAY | Freq: Four times a day (QID) | RESPIRATORY_TRACT | Status: DC | PRN

## 2014-08-02 MED ORDER — LAMOTRIGINE 100 MG PO TABS
100.0000 mg | ORAL_TABLET | Freq: Every day | ORAL | Status: DC
  Administered 2014-08-03: 100 mg via ORAL
  Filled 2014-08-02 (×3): qty 1

## 2014-08-02 MED ORDER — TRAZODONE HCL 50 MG PO TABS
50.0000 mg | ORAL_TABLET | Freq: Every evening | ORAL | Status: DC | PRN
Start: 2014-08-02 — End: 2014-08-03
  Filled 2014-08-02 (×2): qty 28
  Filled 2014-08-02 (×6): qty 1

## 2014-08-02 MED ORDER — ALUM & MAG HYDROXIDE-SIMETH 200-200-20 MG/5ML PO SUSP: 30.0000 mL | ORAL | Status: DC | PRN

## 2014-08-02 MED ORDER — SENNOSIDES-DOCUSATE SODIUM 8.6-50 MG PO TABS: 2.0000 | ORAL_TABLET | Freq: Every evening | ORAL | Status: DC | PRN

## 2014-08-02 MED ORDER — FERROUS SULFATE 325 (65 FE) MG PO TABS
325.0000 mg | ORAL_TABLET | Freq: Every day | ORAL | Status: DC
Start: 2014-08-02 — End: 2014-08-03
  Administered 2014-08-03: 325 mg via ORAL
  Filled 2014-08-02 (×5): qty 1

## 2014-08-02 MED ORDER — FLUOXETINE HCL 20 MG PO CAPS
20.0000 mg | ORAL_CAPSULE | Freq: Every day | ORAL | Status: DC
  Administered 2014-08-02 – 2014-08-03 (×2): 20 mg via ORAL
  Filled 2014-08-02 (×4): qty 1

## 2014-08-02 MED ORDER — ACETAMINOPHEN 325 MG PO TABS
650.0000 mg | ORAL_TABLET | Freq: Four times a day (QID) | ORAL | Status: DC | PRN
  Administered 2014-08-03: 650 mg via ORAL
  Filled 2014-08-02: qty 2

## 2014-08-02 MED ORDER — ALPRAZOLAM 0.25 MG PO TABS
0.2500 mg | ORAL_TABLET | Freq: Two times a day (BID) | ORAL | Status: DC | PRN
Start: 2014-08-02 — End: 2014-08-03
  Administered 2014-08-02 – 2014-08-03 (×2): 0.25 mg via ORAL
  Filled 2014-08-02 (×2): qty 1

## 2014-08-02 MED ORDER — MAGNESIUM HYDROXIDE 400 MG/5ML PO SUSP: 30.0000 mL | Freq: Every day | ORAL | Status: DC | PRN

## 2014-08-02 MED ORDER — IBUPROFEN 600 MG PO TABS
600.0000 mg | ORAL_TABLET | Freq: Four times a day (QID) | ORAL | Status: DC | PRN
  Administered 2014-08-03: 600 mg via ORAL
  Filled 2014-08-02: qty 1
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
## 2014-08-02 NOTE — Clinical Social Work Note (Signed)
CSW met with patient to complete PSA.  Patient very guarded in her responses.  She advised of admitting to the hospital because she was having a manic episode and needed to get away from the apartment.  She is denying any symptoms at this time and stated she wants to discharge.  Patient advised she would be remaining in the hospital overnight and can discuss d/c with MD tomorrow.  She shared children's father is on probation and needs to get back to work before he loses his job.  Patient declined collateral contact.
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
## 2014-08-02 NOTE — Progress Notes (Addendum)
Patient refused iron pill and lamictal 100 mg this morning.  MD informed.  Patient has not attended groups today.  Presently sleeping in her bed.  Respirations even and unlabored.  No signs/symptoms of pain/distress noted on patient's face/body movements.  Safety maintained with 15 minute checks. Patient refused to fill out self inventory sheet today.

## 2014-08-02 NOTE — Progress Notes (Signed)
Recreation Therapy Notes  Animal-Assisted Activity/Therapy (AAA/T) Program Checklist/Progress Notes Patient Eligibility Criteria Checklist & Daily Group note for Rec Tx Intervention  Date: 01.12.2016 Time: 2:45pm [address]   AAA/T Program Assumption of Risk Form signed by Patient/ or Parent Legal Guardian yes  Patient is free of allergies or sever asthma yes  Patient reports no fear of animals yes  Patient reports no history of cruelty to animals yes  Patient understands his/her participation is voluntary yes  Patient washes hands before animal contact yes  Patient washes hands after animal contact yes  Behavioral Response: Appropriate   Education: Hand Washing, Appropriate Animal Interaction   Education Outcome: Acknowledges education.   Clinical Observations/Feedback: Patient interacted appropriately with peers and therapy dog during session.   Marykay Lexenise L Sylvia Kondracki, LRT/CTRS  Jearl KlinefelterBlanchfield, Kamran Coker L 08/02/2014 4:22 PM

## 2014-08-02 NOTE — Progress Notes (Signed)
Pt is a 29 year old female admitted with attempted overdose after parole officer went to pick her up and she was acting confused    Pt denies drugs and ETOH  But did say she took a handful of her prescribed medications   She is irritable and resistent to admission process   She denies need to be here and said she wants to leave after the doctor sees her    Pt denies suicidal ideation at present   She asked to use the phone and was yelling at the person on the other end of the line   She claims to not remember anything about the day   Pt was admitted to the unit and offered nourishment   She was oriented to the unit and her admission was completed   Q 15 min checks   Pt safe and adjusting to the unit
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
## 2014-08-02 NOTE — [facility] Group Notes (Signed)
[facility] LCSW Group Therapy  Feelings Around Diagnosis 1:15 - 2:30 PM  08/02/2014 3:20 PM  Type of Therapy:  Group Therapy  Participation Level:  Did Not Attend - patient invited to attend group but did not attend.  Wynn BankerHodnett, Nichoals Heyde Hairston 08/02/2014, 3:20 PM

## 2014-08-02 NOTE — Progress Notes (Signed)
Pt attended spiritual care group on grief and loss facilitated by Counseling intern [place]az[place]Jordan Austin and chaplain Burnis KingfisherMatthew Jumanah Hynson. Group opened with brief discussion and psycho-social ed around grief and loss in relationships and in relation to self - identifying life patterns, circumstances, changes that cause losses. Established group norm of speaking from own life experience. Group goal of establishing open and affirming space for members to share loss and experience with grief, normalize grief experience and provide psycho social education and grief support.  Group drew on narrative and Alderian therapeutic modalities.    Virginia Hunter came into group late and sat behind facilitator.  Another group member expressed some frustration with Virginia Hunter prior to his leaving the room.  Throughout group, Virginia Hunter was disengaged and appeared resistant.  Virginia Hunter engaged one other group member by asking whether sharing with group "make you feel better."    Group member responded by describing his experience of sharing with group.  Facilitators redirected group to affirm member and value process of group hearing one another's grief journey.   Virginia Hunter left group.   Virginia Hunter, Virginia Hunter MDiv

## 2014-08-02 NOTE — Tx Team (Signed)
Initial Interdisciplinary Treatment Plan   PATIENT STRESSORS: Legal issue Medication change or noncompliance   PATIENT STRENGTHS: General fund of knowledge Supportive family/friends   PROBLEM LIST: Problem List/Patient Goals Date to be addressed Date deferred Reason deferred Estimated date of resolution  Pt denies need to be here therefore she does not have anything she wants to work on                                                       DISCHARGE CRITERIA:  Improved stabilization in mood, thinking, and/or behavior Need for constant or close observation no longer present Verbal commitment to aftercare and medication compliance  PRELIMINARY DISCHARGE PLAN: Attend aftercare/continuing care group Return to previous living arrangement  PATIENT/FAMIILY INVOLVEMENT: This treatment plan has been presented to and reviewed with the patient, Virginia Hunter, and/or family member, .  The patient and family have been given the opportunity to ask questions and make suggestions.  Andrena Mewsuttall, Natashia Roseman J 08/02/2014, 4:30 AM

## 2014-08-02 NOTE — Plan of Care (Signed)
Problem: Consults Goal: Suicide Risk Patient Education (See Patient Education module for education specifics)  Outcome: Completed/Met Date Met:  08/02/14 Nurse discussed suicidal information with patient.        

## 2014-08-02 NOTE — [facility] Counselor (Signed)
Adult Comprehensive Assessment  Patient ID: Virginia Hunter, female   DOB: 1986/03/23, 29 y.o.   MRN: [id number]  Information Source:    Current Stressors:  Educational / Learning stressors: None Employment / Job issues: None Family Relationships: None Surveyor, quantityinancial / Lack of resources (include bankruptcy): None Housing / Lack of housing: None Physical health (include injuries & life threatening diseases): None Social relationships: None Substance abuse: None Bereavement / Loss: None  Living/Environment/Situation:  Living Arrangements: Parent, Children Living conditions (as described by patient or guardian): Patient reports having okay living conditions - apartment could be bigger How long has patient lived in current situation?: One year What is atmosphere in current home: Comfortable  Family History:  Marital status: Single Does patient have children?: Yes How many children?: 2 How is patient's relationship with their children?: Patient reports having a good relationship with her children agaes six months and four years  Childhood History:  By whom was/is the patient raised?: Foster parents Additional childhood history information: Patient reports having a good childhood Description of patient's relationship with caregiver when they were a child: Good relationship with parents as a child Patient's description of current relationship with people who raised him/her: Good relationship Does patient have siblings?: Yes Number of Siblings: 1 Description of patient's current relationship with siblings: Okay relationship Did patient suffer any verbal/emotional/physical/sexual abuse as a child?: No Did patient suffer from severe childhood neglect?: No Has patient ever been sexually abused/assaulted/raped as an adolescent or adult?: No Was the patient ever a victim of a crime or a disaster?: No Witnessed domestic violence?: No Has patient been effected by domestic violence as an  adult?: No  Education:  Highest grade of school patient has completed: Four year colllege degree Currently a student?: No Learning disability?: No  Employment/Work Situation:   Employment situation: Employed Where is patient currently employed?: Patient declined to answer How long has patient been employed?: A  year and a half Patient's job has been impacted by current illness: No What is the longest time patient has a held a job?: Four years 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 Has patient ever been in the [employer] and Companymilitary?: No Has patient ever served in Buyer, retailcombat?: No  Financial Resources:   Financial resources: Income from employment Does patient have a representative payee or guardian?: No  Alcohol/Substance Abuse:   If attempted suicide, did drugs/alcohol play a role in this?: No Alcohol/Substance Abuse Treatment Hx: Denies past history Has alcohol/substance abuse ever caused legal problems?: No  Social Support System:   Patient's Community Support System: Fair Museum/gallery exhibitions officerDescribe Community Support System: Patient advised of being active in her church Type of faith/religion: Ephriam KnucklesChristian How does patient's faith help to cope with current illness?: Patient attends church, prays and reads her Bible  Leisure/Recreation:   Leisure and Hobbies: Loves time with her children  Strengths/Needs:   What things does the patient do well?: Being a mother In what areas does patient struggle / problems for patient: Being a mother  Discharge Plan:   Does patient have access to transportation?: Yes Will patient be returning to same living situation after discharge?: Yes Currently receiving community mental health services: No If no, would patient like referral for services when discharged?: Yes (What county?) (Family Service) Does patient have financial barriers related to discharge medications?: No  Summary/Recommendations:  Virginia Hunter is a 29 years old  female admitted with Bipolar Affective Disorder.  Patient very guarded in providing answers.  She will  benefit from crisis stabilization, evaluation for medication, psycho-education groups for coping skills development, group therapy and case management for discharge planning.     Virginia Hunter, Joesph July. 08/02/2014

## 2014-08-02 NOTE — [facility] Group Notes (Signed)
The focus of this group is to educate the patient on the purpose and policies of crisis stabilization and provide a format to answer questions about their admission.  The group details unit policies and expectations of patients while admitted.  Patient did not attend 0900 nurse education orientation group this morning.  Patient with MD at this time.

## 2014-08-02 NOTE — [facility] Counselor (Signed)
CSW attempted to meet with patient to complete PSA.  Patient declined.

## 2014-08-02 NOTE — [facility] Counselor (Signed)
Support paperwork completed with patient. [facility]   Cyndie MullAnna Fabiana Dromgoole, [facility] Triage Specialist

## 2014-08-02 NOTE — [facility] Suicide Risk Assessment (Signed)
[facility] INPATIENT:  Family/Significant Other Suicide Prevention Education  Suicide Prevention Education:  Patient Refusal for Family/Significant Other Suicide Prevention Education: The patient Virginia Hunter has refused to provide written consent for family/significant other to be provided Family/Significant Other Suicide Prevention Education during admission and/or prior to discharge.  Physician notified.  Wynn BankerHodnett, Virginia Hunter 08/02/2014, 3:21 PM

## 2014-08-02 NOTE — [facility] Suicide Risk Assessment (Signed)
   Nursing information obtained from:    Demographic factors:   29 year old female , lives with SO- has two children, currently with their father.  Current Mental Status:   See below  Loss Factors:   Difficult to assess- patient describes legal issues  Historical Factors:   Denies  Risk Reduction Factors:   Sense of responsibility to family Total Time spent with patient: 45 minutes  CLINICAL FACTORS:  Brought to ED by [employer] PipelineProbation Officer- reportedly confused. Patient states she does not remember events that occurred yesterday  Psychiatric Specialty Exam: Physical Exam  ROS  Blood pressure 112/66, pulse 92, temperature 98.3 F (36.8 C), temperature source Oral, resp. rate 16, height 5\' 2"  (1.575 m), weight 144 lb (65.318 kg), last menstrual period 07/31/2014, unknown if currently breastfeeding.Body mass index is 26.33 kg/(m^2).  General Appearance: Fairly Groomed  Patent attorneyye Contact::  Fair  Speech:  Slow  Volume:  Normal  Mood:  Dysphoric and Irritable  Affect:  Labile  Thought Process:  Linear  Orientation:  Other:  at this time is fully alert and attentive, [facility]  Thought Content:  deniesn hallucinations, no delusions expressed. Minimizes any recent events and states she has been " fine". Very focused on being discharged today.  Suicidal Thoughts:  No- at this time denies plan or intention of hurting self or anyone else   Homicidal Thoughts:  No  Memory:  recent and remote poor- states she has little recollection of recent events  Judgement:  Impaired  Insight:  Lacking  Psychomotor Activity:  Decreased- in bed, refusing to get up at this time  Concentration:  Fair  Recall:  Poor  Fund of Knowledge:Good  Language: Fair  Akathisia:  No  Handed:  Right  AIMS (if indicated):     Assets:  Physical Health Resilience  Sleep:      Musculoskeletal: Strength & Muscle Tone: within normal limits Gait & Station: gait not examined at this time Patient leans: N/A  COGNITIVE  FEATURES THAT CONTRIBUTE TO RISK:  Closed-mindedness Polarized thinking    SUICIDE RISK:   Moderate:  Frequent suicidal ideation with limited intensity, and duration, some specificity in terms of plans, no associated intent, good self-control, limited dysphoria/symptomatology, some risk factors present, and identifiable protective factors, including available and accessible social support.  PLAN OF CARE: Patient will be admitted to inpatient psychiatric unit for stabilization and safety. Will provide and encourage milieu participation. Provide medication management and maked adjustments as needed.  Will follow daily.    I certify that inpatient services furnished can reasonably be expected to improve the patient's condition.  COBOS, FERNANDO 08/02/2014, 3:52 PM

## 2014-08-03 DIAGNOSIS — F4310 Post-traumatic stress disorder, unspecified: Secondary | ICD-10-CM

## 2014-08-03 DIAGNOSIS — F410 Panic disorder [episodic paroxysmal anxiety] without agoraphobia: Secondary | ICD-10-CM

## 2014-08-03 MED ORDER — IBUPROFEN 600 MG PO TABS: 600.0000 mg | ORAL_TABLET | Freq: Four times a day (QID) | ORAL | Status: DC | PRN

## 2014-08-03 MED ORDER — DM-GUAIFENESIN ER 30-600 MG PO TB12
1.0000 | ORAL_TABLET | Freq: Two times a day (BID) | ORAL | Status: DC
  Administered 2014-08-03: 1 via ORAL
  Filled 2014-08-03 (×5): qty 1

## 2014-08-03 MED ORDER — QUETIAPINE FUMARATE 50 MG PO TABS: 50.0000 mg | ORAL_TABLET | Freq: Every day | ORAL | Status: DC

## 2014-08-03 MED ORDER — ALBUTEROL SULFATE HFA 108 (90 BASE) MCG/ACT IN AERS: 2.0000 | INHALATION_SPRAY | RESPIRATORY_TRACT | Status: DC | PRN

## 2014-08-03 MED ORDER — LAMOTRIGINE 25 MG PO TABS
25.0000 mg | ORAL_TABLET | Freq: Every day | ORAL | Status: DC
Start: 2014-08-03 — End: 2014-08-03
  Filled 2014-08-03 (×3): qty 1
  Filled 2014-08-03: qty 14

## 2014-08-03 MED ORDER — QUETIAPINE FUMARATE 100 MG PO TABS: 100.0000 mg | ORAL_TABLET | Freq: Every day | ORAL | Status: DC

## 2014-08-03 MED ORDER — SENNOSIDES-DOCUSATE SODIUM 8.6-50 MG PO TABS: 2.0000 | ORAL_TABLET | Freq: Every evening | ORAL | Status: DC | PRN

## 2014-08-03 MED ORDER — LAMOTRIGINE 25 MG PO TABS: 25.0000 mg | ORAL_TABLET | Freq: Every day | ORAL | Status: DC

## 2014-08-03 MED ORDER — QUETIAPINE FUMARATE 100 MG PO TABS
100.0000 mg | ORAL_TABLET | Freq: Every day | ORAL | Status: DC
  Filled 2014-08-03 (×2): qty 1
  Filled 2014-08-03: qty 14

## 2014-08-03 MED ORDER — FERROUS SULFATE 325 (65 FE) MG PO TABS: 325.0000 mg | ORAL_TABLET | Freq: Every day | ORAL | Status: DC

## 2014-08-03 MED ORDER — TRAZODONE HCL 50 MG PO TABS: 50.0000 mg | ORAL_TABLET | Freq: Every evening | ORAL | Status: DC | PRN

## 2014-08-03 MED ORDER — FLUOXETINE HCL 10 MG PO CAPS: 10.0000 mg | ORAL_CAPSULE | Freq: Every day | ORAL | Status: DC

## 2014-08-03 MED ORDER — FLUOXETINE HCL 10 MG PO CAPS
10.0000 mg | ORAL_CAPSULE | Freq: Every day | ORAL | Status: DC
Start: 2014-08-04 — End: 2014-08-03
  Filled 2014-08-03 (×2): qty 1
  Filled 2014-08-03: qty 14

## 2014-08-03 NOTE — [facility] Suicide Risk Assessment (Signed)
[facility] INPATIENT:  Family/Significant Other Suicide Prevention Education  Suicide Prevention Education:  Education Completed; Virginia Hunter, Friend, [phone]; has been identified by the patient as the family member/significant other with whom the patient will be residing, and identified as the person(s) who will aid the patient in the event of a mental health crisis (suicidal ideations/suicide attempt).  With written consent from the patient, the family member/significant other has been provided the following suicide prevention education, prior to the and/or following the discharge of the patient.  The suicide prevention education provided includes the following:  Suicide risk factors  Suicide prevention and interventions  National Suicide Hotline telephone number  [facility] assessment telephone number  [facility] [place] Emergency Assistance 911  [facility] and/or Residential Mobile Crisis Unit telephone number  Request made of family/significant other to:  Remove weapons (e.g., guns, rifles, knives), all items previously/currently identified as safety concern.   Friend advised patient does not have access to weapons.     Remove drugs/medications (over-the-counter, prescriptions, illicit drugs), all items previously/currently identified as a safety concern.  The family member/significant other verbalizes understanding of the suicide prevention education information provided.  The family member/significant other agrees to remove the items of safety concern listed above.  Virginia Hunter, Virginia Hunter 08/03/2014, 12:08 PM

## 2014-08-03 NOTE — [facility] Group Notes (Signed)
[facility]   08/03/2014 10:13 AM    Participation Quality:  Appropraite  Mood/Affect:  Appropriate  Depression Rating:  0  Anxiety Rating:  0  Thoughts of Suicide:  No  Will you contract for safety?   NA  Current AVH:  No  Plan for Discharge/Comments:  Patient attended discharge planning group and actively participated in group.  [facility]. Suicide prevention education reviewed and SPE document provided.   Transportation Means: Patient has transportation.   Supports:  Patient has a support system.   Pryor Guettler, Joesph JulyQuylle Hairston

## 2014-08-03 NOTE — Progress Notes (Signed)
[facility] :  Will you be returning to the same living situation after discharge: Yes,  Patient is returning to her home. At discharge, do you have transportation home?:Yes,  Patient is arranging transportation Do you have the ability to pay for your medications:Yes,  Patient is able to afford medications.  Release of information consent forms completed and in the chart;  Patient's signature needed at discharge.  Patient to Follow up at: Follow-up Information    Follow up with Elana AlmFrankie Powell - [facility] On 08/09/2014.   Why:  Tuesday, August 09, 2014 at 8:30 AM.  Please complete the registration form and bring with you to this appointment   Contact information:   [address] [place], [place]   [address]  [phone]      Follow up with Dr. Mich[facility]Agarwal [facility] On 08/25/2014.   Why:  Thursday, August 25, 2013 at 9:15 AM   Contact information:   [address] [place], [place]  [address]  [phone]      Patient denies SI/HI:  Patient no longer endorsing SI/HI or other thoughts of self harm.   Safety Planning and Suicide Prevention discussed:  .Reviewed with all patients during discharge planning group    Patient refused referral  Wynn BankerHodnett, Latese Dufault Hairston 08/03/2014, 11:42 AM
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
## 2014-08-03 NOTE — Progress Notes (Signed)
Patient discharged per physician order; patient denies SI/HI and A/V hallucinations; patient received prescriptions,and copy of AVS after it was reviewed; patient had no other questions or concerns at this time; patient verbalized and signed that she received all belongings; patient left the unit ambulatory
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
## 2014-08-03 NOTE — Progress Notes (Signed)
Patient ID: Virginia Hunter, female   DOB: 1985/09/27, 29 y.o.   MRN: [id number] D: Client seen on hall, reports she is here because "I needed to get away" Client would not be specific, reports "stressful situation" A: Writer introduced self to client, reviewed medications were available for sleep, encouraged group. Staff will monitor q4115min for safety. R: Client is safe on the unit, did not attend group.

## 2014-08-05 NOTE — Progress Notes (Signed)
Patient Discharge Instructions:  Next Level Care Provider Has Access to the EMR, 08/05/14  [facility] via [facility]/Epic access.  Jerelene ReddenSheena E [place], 08/05/2014, 3:02 PM

## 2014-08-09 ENCOUNTER — Ambulatory Visit (HOSPITAL_COMMUNITY): Payer: Self-pay | Admitting: Clinical

## 2014-08-25 ENCOUNTER — Ambulatory Visit (HOSPITAL_COMMUNITY): Payer: Self-pay | Admitting: Psychiatry

## 2014-09-20 ENCOUNTER — Ambulatory Visit (HOSPITAL_COMMUNITY): Payer: Self-pay | Admitting: Psychiatry

## 2014-11-03 ENCOUNTER — Encounter (HOSPITAL_COMMUNITY): Payer: Self-pay | Admitting: Emergency Medicine

## 2014-11-03 ENCOUNTER — Emergency Department (HOSPITAL_COMMUNITY)
Admission: EM | Admit: 2014-11-03 | Discharge: 2014-11-03 | Disposition: A | Discharge status: Home / Self Care | Payer: Medicaid Other | Attending: Emergency Medicine | Admitting: Emergency Medicine

## 2014-11-03 DIAGNOSIS — Z880 Allergy status to penicillin: Secondary | ICD-10-CM | POA: Diagnosis not present

## 2014-11-03 DIAGNOSIS — Z79899 Other long term (current) drug therapy: Secondary | ICD-10-CM | POA: Diagnosis not present

## 2014-11-03 DIAGNOSIS — Z4802 Encounter for removal of sutures: Secondary | ICD-10-CM | POA: Insufficient documentation

## 2014-11-03 DIAGNOSIS — M79674 Pain in right toe(s): Secondary | ICD-10-CM | POA: Diagnosis present

## 2014-11-03 NOTE — ED Provider Notes (Signed)
CSN: [id number]     Arrival date & time 11/03/14  1657 History   This chart was scribed for non-physician practitioner, Will Marijo File, PA-C working with Gerhard Munch, MD, by Jarvis Morgan, ED Scribe. This patient was seen in room WTR6/WTR6 and the patient's care was started at [facility].    Chief Complaint  Patient presents with  . Suture / Staple Removal  . Toe Pain   The history is provided by the patient. No language interpreter was used.    HPI Comments: Virginia Hunter is a 29 y.o. female with no PMH who presents to the Emergency Department complaining of intermittent, throbbing, right big toe pain for 2 weeks.  She states two weeks ago she sustained a laceration to the top of her right big toe when she fell on cement. Pt notes she had the laceration repaired and stitches placed at an Urgent Care in [place]. She reports she was unable to return to that Urgent Care due to an insurance change. She states she was supposed have her stitches removed within 5-7 days but she did not. Pt notes some of the stitches have fallen out but she cut out 2 remaining stiches this morning. She does not know how many stitches were placed. She states some of the stitches have fallen out. She denies any fever, chills, numbness, red streaking, or discharge from the area   Past Medical History  Diagnosis Date  . Medical history non-contributory    Past Surgical History  Procedure Laterality Date  . Cesarean section    . Knee surgery    . Cesarean section N/A 01/03/2014    Procedure: Repeat CESAREAN SECTION;  Surgeon: Sharon Seller, DO;  Location: [facility];  Service: Obstetrics;  Laterality: N/A;   No family history on file. History  Substance Use Topics  . Smoking status: Never Smoker   . Smokeless tobacco: Not on file  . Alcohol Use: Yes   OB History    Gravida Para Term Preterm AB TAB SAB Ectopic Multiple Living   Review of Systems  Constitutional: Negative for fever  and chills.  Musculoskeletal: Positive for arthralgias.  Skin: Positive for wound (stitches previously placed to top of right big toe). Negative for color change and rash.  Neurological: Negative for numbness.     Allergies  Penicillins  Home Medications   Prior to Admission medications   Medication Sig Start Date End Date Taking? Authorizing Provider  ibuprofen (ADVIL,MOTRIN) 200 MG tablet Take 600 mg by mouth every 6 (six) hours as needed for moderate pain (pain).   Yes Historical Provider, MD  lamoTRIgine (LAMICTAL) 25 MG tablet Take 1 tablet (25 mg total) by mouth daily. 08/03/14  Yes Adonis Brook, NP  albuterol (PROAIR HFA) 108 (90 BASE) MCG/ACT inhaler Inhale 2 puffs into the lungs every 4 (four) hours as needed. 08/03/14   Adonis Brook, NP  ferrous sulfate 325 (65 FE) MG tablet Take 1 tablet (325 mg total) by mouth daily with breakfast. Patient not taking: Reported on 11/03/2014 08/03/14   Adonis Brook, NP  FLUoxetine (PROZAC) 10 MG capsule Take 1 capsule (10 mg total) by mouth daily. Patient not taking: Reported on 11/03/2014 08/04/14   Adonis Brook, NP  ibuprofen (ADVIL,MOTRIN) 600 MG tablet Take 1 tablet (600 mg total) by mouth every 6 (six) hours as needed for mild pain. Patient not taking: Reported on 11/03/2014 08/03/14   Velna Hatchet  Dominga FerryAgustin, NP  QUEtiapine (SEROQUEL) 100 MG tablet Take 1 tablet (100 mg total) by mouth at bedtime. Patient not taking: Reported on 11/03/2014 08/03/14   Adonis BrookSheila Agustin, NP  senna-docusate (SENOKOT-S) 8.6-50 MG per tablet Take 2 tablets by mouth at bedtime as needed for mild constipation. Patient not taking: Reported on 11/03/2014 08/03/14   Adonis BrookSheila Agustin, NP  traZODone (DESYREL) 50 MG tablet Take 1 tablet (50 mg total) by mouth at bedtime and may repeat dose one time if needed. Patient not taking: Reported on 11/03/2014 08/03/14   Adonis BrookSheila Agustin, NP   Triage Vitals: BP 137/90 mmHg  Pulse 91  Temp(Src) 98.1 F (36.7 C) (Oral)  Resp 16  SpO2  97%  Physical Exam  Constitutional: She appears well-developed and well-nourished. No distress.  Nontoxic appearing.  HENT:  Head: Normocephalic and atraumatic.  Eyes: Right eye exhibits no discharge. Left eye exhibits no discharge.  Pulmonary/Chest: Effort normal. No respiratory distress.  Neurological: She is alert. Coordination normal.  Skin: Skin is warm and dry. No rash noted. She is not diaphoretic. No pallor.  Scarring overlying dorsal aspect of right great toe. Two pieces of suture material by the suture. No overlying erythema. No abscess. Small amount of clear bloody discharge with removal of suture material. No surrounding erythema. Normal capillary refill. No edema or deformity.   Psychiatric: She has a normal mood and affect. Her behavior is normal.  Nursing note and vitals reviewed.   ED Course  Procedures (including critical care time)  DIAGNOSTIC STUDIES: Oxygen Saturation is 97% on RA, normal by my interpretation.     Labs Review Labs Reviewed - No data to display  Imaging Review No results found.   EKG Interpretation None      Filed Vitals:   11/03/14 1721  BP: 137/90  Pulse: 91  Temp: 98.1 F (36.7 C)  TempSrc: Oral  Resp: 16  SpO2: 97%     MDM   Final diagnoses:  Encounter for removal of sutures   This is a 29 year old female who presents the emergency department complaining of right toe pain due to not having her stitches removed after they replaced 2 weeks ago. She reports they replaced by an urgent care 2 weeks ago and she is unsure of the number of sutures. She reports most of them came out on their own however she had 2 remaining sutures that she noticed. She reports she cut off the tops of them earlier today. When I examine her toes she has 2 remaining pieces of internal suture. These were removed. There was a small amount of clear bloody discharge with removal of the suture material. There is no abscess. There is no erythema. The patient is  afebrile and nontoxic appearing. I advised the patient to watch for signs of infection and if there are any remaining pieces of suture they should come out on their own. I do not feel any remaining suture on her toe. I advised the patient to follow-up with their primary care provider this week. I advised the patient to return to the emergency department with new or worsening symptoms or new concerns. The patient verbalized understanding and agreement with plan.   I personally performed the services described in this documentation, which was scribed in my presence. The recorded information has been reviewed and is accurate.       Everlene FarrierWilliam Devin Foskey, PA-C 11/03/14 1908  Gerhard Munchobert Lockwood, MD 11/04/14 [phone]

## 2014-11-03 NOTE — Discharge Instructions (Signed)
Suture Removal, Care After Refer to this sheet in the next few weeks. These instructions provide you with information on caring for yourself after your procedure. Your health care provider may also give you more specific instructions. Your treatment has been planned according to current medical practices, but problems sometimes occur. Call your health care provider if you have any problems or questions after your procedure. WHAT TO EXPECT AFTER THE PROCEDURE After your stitches (sutures) are removed, it is typical to have the following:  Some discomfort and swelling in the wound area.  Slight redness in the area. HOME CARE INSTRUCTIONS   If you have skin adhesive strips over the wound area, do not take the strips off. They will fall off on their own in a few days. If the strips remain in place after 14 days, you may remove them.  Change any bandages (dressings) at least once a day or as directed by your health care provider. If the bandage sticks, soak it off with warm, soapy water.  Apply cream or ointment only as directed by your health care provider. If using cream or ointment, wash the area with soap and water 2 times a day to remove all the cream or ointment. Rinse off the soap and pat the area dry with a clean towel.  Keep the wound area dry and clean. If the bandage becomes wet or dirty, or if it develops a bad smell, change it as soon as possible.  Continue to protect the wound from injury.  Use sunscreen when out in the sun. New scars become sunburned easily. SEEK MEDICAL CARE IF:  You have increasing redness, swelling, or pain in the wound.  You see pus coming from the wound.  You have a fever.  You notice a bad smell coming from the wound or dressing.  Your wound breaks open (edges not staying together). Document Released: 04/02/2001 Document Revised: 04/28/2013 Document Reviewed: 02/17/2013 [facility] Patient Information 2015 [place], [place]. This information is not  intended to replace advice given to you by your health care provider. Make sure you discuss any questions you have with your health care provider.  Incision Care An incision is when a surgeon cuts into your body tissues. After surgery, the incision needs to be cared for properly to prevent infection.  HOME CARE INSTRUCTIONS   Take all medicine as directed by your caregiver. Only take over-the-counter or prescription medicines for pain, discomfort, or fever as directed by your caregiver.  Do not remove your bandage (dressing) or get your incision wet until your surgeon gives you permission. In the event that your dressing becomes wet, dirty, or starts to smell, change the dressing and call your surgeon for instructions as soon as possible.  Take showers. Do not take tub baths, swim, or do anything that may soak the wound until it is healed.  Resume your normal diet and activities as directed or allowed.  Avoid lifting any weight until you are instructed otherwise.  Use anti-itch antihistamine medicine as directed by your caregiver. The wound may itch when it is healing. Do not pick or scratch at the wound.  Follow up with your caregiver for stitch (suture) or staple removal as directed.  Drink enough fluids to keep your urine clear or pale yellow. SEEK MEDICAL CARE IF:   You have redness, swelling, or increasing pain in the wound that is not controlled with medicine.  You have drainage, blood, or pus coming from the wound that lasts longer than 1 day.  You develop muscle aches, chills, or a general ill feeling.  You notice a bad smell coming from the wound or dressing.  Your wound edges separate after the sutures, staples, or skin adhesive strips have been removed.  You develop persistent nausea or vomiting. SEEK IMMEDIATE MEDICAL CARE IF:   You have a fever.  You develop a rash.  You develop dizzy episodes or faint while standing.  You have difficulty breathing.  You  develop any reaction or side effects to medicine given. MAKE SURE YOU:   Understand these instructions.  Will watch your condition.  Will get help right away if you are not doing well or get worse. Document Released: 01/25/2005 Document Revised: 09/30/2011 Document Reviewed: 09/01/2013 [facility] Patient Information 2015 [place], [place]. This information is not intended to replace advice given to you by your health care provider. Make sure you discuss any questions you have with your health care provider. Dressing Change A dressing is a material placed over wounds. It keeps the wound clean, dry, and protected from further injury. This provides an environment that favors wound healing.  BEFORE YOU BEGIN  Get your supplies together. Things you may need include:  Saline solution.  Flexible gauze dressing.  Medicated cream.  Tape.  Gloves.  Abdominal dressing pads.  Gauze squares.  Plastic bags.  Take pain medicine 30 minutes before the dressing change if you need it.  Take a shower before you do the first dressing change of the day. Use plastic wrap or a plastic bag to prevent the dressing from getting wet. REMOVING YOUR OLD DRESSING   Wash your hands with soap and water. Dry your hands with a clean towel.  Put on your gloves.  Remove any tape.  Carefully remove the old dressing. If the dressing sticks, you may dampen it with warm water to loosen it, or follow your caregiver's specific directions.  Remove any gauze or packing tape that is in your wound.  Take off your gloves.  Put the gloves, tape, gauze, or any packing tape into a plastic bag. CHANGING YOUR DRESSING  Open the supplies.  Take the cap off the saline solution.  Open the gauze package so that the gauze remains on the inside of the package.  Put on your gloves.  Clean your wound as told by your caregiver.  If you have been told to keep your wound dry, follow those instructions.  Your caregiver  may tell you to do one or more of the following:  Pick up the gauze. Pour the saline solution over the gauze. Squeeze out the extra saline solution.  Put medicated cream or other medicine on your wound if you have been told to do so.  Put the solution soaked gauze only in your wound, not on the skin around it.  Pack your wound loosely or as told by your caregiver.  Put dry gauze on your wound.  Put abdominal dressing pads over the dry gauze if your wet gauze soaks through.  Tape the abdominal dressing pads in place so they will not fall off. Do not wrap the tape completely around the affected part (arm, leg, abdomen).  Wrap the dressing pads with a flexible gauze dressing to secure it in place.  Take off your gloves. Put them in the plastic bag with the old dressing. Tie the bag shut and throw it away.  Keep the dressing clean and dry until your next dressing change.  Wash your hands. SEEK MEDICAL CARE IF:  Your  skin around the wound looks red.  Your wound feels more tender or sore.  You see pus in the wound.  Your wound smells bad.  You have a fever.  Your skin around the wound has a rash that itches and burns.  You see black or yellow skin in your wound that was not there before.  You feel nauseous, throw up, and feel very tired. Document Released: 08/15/2004 Document Revised: 09/30/2011 Document Reviewed: 05/20/2011 [facility] Patient Information 2015 [place], [place]. This information is not intended to replace advice given to you by your health care provider. Make sure you discuss any questions you have with your health care provider.

## 2014-11-03 NOTE — ED Notes (Signed)
Pt was seen two weeks ago for R big toe laceration. Pt had multiple stitches put in and was told to return in 5-7 days to have them removed. Pt never went back to have stitches removed and today she went and cut one of the stitches herself. Pt sts "the bottom of the stitch is still in my toe and I'm worried I'm going to get an infection in my entire body and die."  Pt also asks RN "What are the chances you will have to amputate my toe?"  Pt c/o throbbing pain in her toe since the stitches were placed. Denies worsening pain. Pt A&Ox4 and appears very anxious.

## 2014-11-03 NOTE — ED Notes (Signed)
Bed: WTR6 Expected date:  Expected time:  Means of arrival:  Comments: EVS 

## 2015-08-07 ENCOUNTER — Encounter (HOSPITAL_COMMUNITY): Payer: Self-pay

## 2015-08-07 ENCOUNTER — Emergency Department (HOSPITAL_COMMUNITY)
Admission: EM | Admit: 2015-08-07 | Discharge: 2015-08-08 | Disposition: A | Discharge status: Other Acute Inpatient Hospital | Payer: Medicaid Other | Attending: Emergency Medicine | Admitting: Emergency Medicine

## 2015-08-07 DIAGNOSIS — S50812A Abrasion of left forearm, initial encounter: Secondary | ICD-10-CM | POA: Insufficient documentation

## 2015-08-07 DIAGNOSIS — Z79899 Other long term (current) drug therapy: Secondary | ICD-10-CM | POA: Diagnosis not present

## 2015-08-07 DIAGNOSIS — Y9389 Activity, other specified: Secondary | ICD-10-CM | POA: Diagnosis not present

## 2015-08-07 DIAGNOSIS — F1310 Sedative, hypnotic or anxiolytic abuse, uncomplicated: Secondary | ICD-10-CM | POA: Diagnosis not present

## 2015-08-07 DIAGNOSIS — F1210 Cannabis abuse, uncomplicated: Secondary | ICD-10-CM | POA: Diagnosis not present

## 2015-08-07 DIAGNOSIS — T510X2A Toxic effect of ethanol, intentional self-harm, initial encounter: Secondary | ICD-10-CM | POA: Insufficient documentation

## 2015-08-07 DIAGNOSIS — T424X2A Poisoning by benzodiazepines, intentional self-harm, initial encounter: Secondary | ICD-10-CM | POA: Insufficient documentation

## 2015-08-07 DIAGNOSIS — Y998 Other external cause status: Secondary | ICD-10-CM | POA: Insufficient documentation

## 2015-08-07 DIAGNOSIS — X788XXA Intentional self-harm by other sharp object, initial encounter: Secondary | ICD-10-CM | POA: Diagnosis not present

## 2015-08-07 DIAGNOSIS — Z880 Allergy status to penicillin: Secondary | ICD-10-CM | POA: Diagnosis not present

## 2015-08-07 DIAGNOSIS — Z23 Encounter for immunization: Secondary | ICD-10-CM | POA: Insufficient documentation

## 2015-08-07 DIAGNOSIS — Z7289 Other problems related to lifestyle: Secondary | ICD-10-CM

## 2015-08-07 DIAGNOSIS — Z3202 Encounter for pregnancy test, result negative: Secondary | ICD-10-CM | POA: Diagnosis not present

## 2015-08-07 DIAGNOSIS — R45851 Suicidal ideations: Secondary | ICD-10-CM

## 2015-08-07 DIAGNOSIS — Y9289 Other specified places as the place of occurrence of the external cause: Secondary | ICD-10-CM | POA: Insufficient documentation

## 2015-08-07 DIAGNOSIS — S51812A Laceration without foreign body of left forearm, initial encounter: Secondary | ICD-10-CM | POA: Diagnosis not present

## 2015-08-07 DIAGNOSIS — T50902A Poisoning by unspecified drugs, medicaments and biological substances, intentional self-harm, initial encounter: Secondary | ICD-10-CM

## 2015-08-07 MED ORDER — BACITRACIN ZINC 500 UNIT/GM EX OINT
TOPICAL_OINTMENT | Freq: Two times a day (BID) | CUTANEOUS | Status: DC
  Administered 2015-08-08: 2 via TOPICAL
  Filled 2015-08-07: qty 1.8

## 2015-08-07 NOTE — ED Notes (Signed)
Pt overdosed on wine and xanax, she also has superficial lacerations on her arms, she drank a box of wine and took unknown xanaxs, she actually showed the boyfriend the pills in her mouth before she swallowed them, the boyfriend called EMS after he noticed she was bleeding

## 2015-08-07 NOTE — ED Provider Notes (Signed)
CSN: [id number]     Arrival date & time 08/07/15  2301 History  By signing my name below, I, Budd Palmer, attest that this documentation has been prepared under the direction and in the presence of Laurence Spates, MD. Electronically Signed: Budd Palmer, ED Scribe. 08/07/2015. 11:56 PM.     Chief Complaint  Patient presents with  . Suicidal   The history is provided by the patient. No language interpreter was used.   HPI Comments: Virginia Hunter is a 30 y.o. female brought in by ambulance, who presents to the Emergency Department complaining of SI since age 71, worsening tonight to the point of a drug OD tonight. Pt states she overdosed on a box of wine and more than 10 pills of low-dose Xanax, which was prescribed to her a long time ago but did not take at that time due to wanting to breast feed her child. She does not recall what triggered this particular episode. She denies taking anything else tonight. She notes she had a fight with her boyfriend about drinking the wine today. She states she was hospitalized one year ago for depression. She also endorses cutting behavior. She notes she has two children who are currently with their father. She endorses occasional use of marijuana. She is not UTD on her tetanus vaccination. Pt denies any pains or HI.   Past Medical History  Diagnosis Date  . Medical history non-contributory    Past Surgical History  Procedure Laterality Date  . Cesarean section    . Knee surgery    . Cesarean section N/A 01/03/2014    Procedure: Repeat CESAREAN SECTION;  Surgeon: Sharon Seller, DO;  Location: [facility];  Service: Obstetrics;  Laterality: N/A;   History reviewed. No pertinent family history. Social History  Substance Use Topics  . Smoking status: Never Smoker   . Smokeless tobacco: None  . Alcohol Use: Yes   OB History    Gravida Para Term Preterm AB TAB SAB Ectopic Multiple Living   3 2 2       2      Review of Systems 10 Systems  reviewed and all are negative for acute change except as noted in the HPI.   Allergies  Penicillins  Home Medications   Prior to Admission medications   Medication Sig Start Date End Date Taking? Authorizing Provider  acetaminophen (TYLENOL) 500 MG tablet Take 1,000 mg by mouth every 6 (six) hours as needed for mild pain.   Yes Historical Provider, MD  albuterol (PROAIR HFA) 108 (90 BASE) MCG/ACT inhaler Inhale 2 puffs into the lungs every 4 (four) hours as needed. 08/03/14  Yes Adonis Brook, NP  ibuprofen (ADVIL,MOTRIN) 200 MG tablet Take 600 mg by mouth every 6 (six) hours as needed for moderate pain (pain).   Yes Historical Provider, MD  Prenatal Vit-Fe Fumarate-FA (PRENATAL MULTIVITAMIN) TABS tablet Take 1 tablet by mouth daily at 12 noon.   Yes Historical Provider, MD  vitamin B-12 (CYANOCOBALAMIN) 1000 MCG tablet Take 1,000 mcg by mouth daily.   Yes Historical Provider, MD  ferrous sulfate 325 (65 FE) MG tablet Take 1 tablet (325 mg total) by mouth daily with breakfast. Patient not taking: Reported on 11/03/2014 08/03/14   Adonis Brook, NP  FLUoxetine (PROZAC) 10 MG capsule Take 1 capsule (10 mg total) by mouth daily. Patient not taking: Reported on 11/03/2014 08/04/14   Adonis Brook, NP  ibuprofen (ADVIL,MOTRIN) 600 MG tablet Take 1 tablet (600 mg total) by mouth  every 6 (six) hours as needed for mild pain. Patient not taking: Reported on 11/03/2014 08/03/14   Adonis Brook, NP  lamoTRIgine (LAMICTAL) 25 MG tablet Take 1 tablet (25 mg total) by mouth daily. Patient not taking: Reported on 08/08/2015 08/03/14   Adonis Brook, NP  QUEtiapine (SEROQUEL) 100 MG tablet Take 1 tablet (100 mg total) by mouth at bedtime. Patient not taking: Reported on 11/03/2014 08/03/14   Adonis Brook, NP  senna-docusate (SENOKOT-S) 8.6-50 MG per tablet Take 2 tablets by mouth at bedtime as needed for mild constipation. Patient not taking: Reported on 11/03/2014 08/03/14   Adonis Brook, NP  traZODone  (DESYREL) 50 MG tablet Take 1 tablet (50 mg total) by mouth at bedtime and may repeat dose one time if needed. Patient not taking: Reported on 11/03/2014 08/03/14   Adonis Brook, NP   BP 112/74 mmHg  Pulse 84  Temp(Src) 98.6 F (37 C) (Oral)  Resp 22  SpO2 96% Physical Exam  Constitutional: She is oriented to person, place, and time. She appears well-developed and well-nourished. No distress.  sleepy  HENT:  Head: Normocephalic and atraumatic.  Moist mucous membranes  Eyes: Conjunctivae are normal. Pupils are equal, round, and reactive to light.  Dilated pupils  Neck: Neck supple.  Cardiovascular: Normal rate, regular rhythm and normal heart sounds.   No murmur heard. Pulmonary/Chest: Effort normal and breath sounds normal.  Abdominal: Soft. Bowel sounds are normal. She exhibits no distension. There is no tenderness.  Musculoskeletal: She exhibits no edema.  Neurological: She is alert and oriented to person, place, and time.  Slightly slurred speech  Skin: Skin is warm and dry.  Superficial, linear abrasions and lacerations along dorsal left forearm from elbow to wrist. Superficial healing lacerations and scars on right anterior thigh.  Psychiatric:  Depressed mood, flat affect, avoids eye contact  Nursing note and vitals reviewed.   ED Course  Procedures  DIAGNOSTIC STUDIES: Oxygen Saturation is 98% on RA, normal by my interpretation.    COORDINATION OF CARE: 11:50 PM - Discussed plans to cleanse and dress the wound as well as to order diagnostic studies and a Tdap. Pt advised of plan for treatment and pt agrees.  Labs Review Labs Reviewed  COMPREHENSIVE METABOLIC PANEL - Abnormal; Notable for the following:    Sodium 146 (*)    Alkaline Phosphatase 34 (*)    All other components within normal limits  ETHANOL - Abnormal; Notable for the following:    Alcohol, Ethyl (B) 112 (*)    All other components within normal limits  CBC - Abnormal; Notable for the following:     RBC 3.51 (*)    Hemoglobin 11.7 (*)    HCT 34.2 (*)    All other components within normal limits  SALICYLATE LEVEL  ACETAMINOPHEN LEVEL  URINE RAPID DRUG SCREEN, HOSP PERFORMED  I-STAT BETA HCG BLOOD, ED ([facility], [facility], [facility] ONLY)    Imaging Review No results found. I have personally reviewed and evaluated these lab results as part of my medical decision-making.   EKG Interpretation   Date/Time:  Monday August 07 2015 23:56:29 EST Ventricular Rate:  65 PR Interval:  178 QRS Duration: 88 QT Interval:  429 QTC Calculation: 446 R Axis:   84 Text Interpretation:  Sinus rhythm Low voltage, precordial leads EKG  WITHIN NORMAL LIMITS Confirmed by Wilferd Ritson MD, Kentaro Alewine ([id number]) on 08/08/2015  12:02:20 AM     Medications  bacitracin ointment (2 application Topical Given 08/08/15 0002)  Tdap (BOOSTRIX)  injection 0.5 mL (0.5 mLs Intramuscular Given 08/08/15 0231)    MDM   Final diagnoses:  Intentional overdose of drug in tablet form (HCC)  Suicidal ideation  Deliberate self-cutting   Patient presents by EMS after an intentional ingestion of a box of wine and multiple Xanax tablets in an intentional overdose. On exam, patient was sleepy with slurred speech but was otherwise stable with normal vital signs. She had bleeding from her left arm from superficial self-inflicted lacerations but no deep lacerations requiring repair. EKG unremarkable. Obtained above lab work including toxicology labs which were normal. I have consulted TTS for evaluation and I anticipate admission given patient's suicide attempt and cutting behavior.  I personally performed the services described in this documentation, which was scribed in my presence. The recorded information has been reviewed and is accurate.   Laurence Spatesachel Morgan Erasmus Bistline, MD 08/08/15 [phone]

## 2015-08-08 ENCOUNTER — Inpatient Hospital Stay (HOSPITAL_COMMUNITY)
Admission: EM | Admit: 2015-08-08 | Discharge: 2015-08-11 | DRG: 885 | Disposition: A | Discharge status: Home / Self Care | Payer: Medicaid Other | Source: Intra-hospital | Attending: Psychiatry | Admitting: Psychiatry

## 2015-08-08 ENCOUNTER — Encounter (HOSPITAL_COMMUNITY): Payer: Self-pay | Admitting: *Deleted

## 2015-08-08 DIAGNOSIS — F1020 Alcohol dependence, uncomplicated: Secondary | ICD-10-CM | POA: Diagnosis not present

## 2015-08-08 DIAGNOSIS — F489 Nonpsychotic mental disorder, unspecified: Secondary | ICD-10-CM | POA: Diagnosis not present

## 2015-08-08 DIAGNOSIS — F329 Major depressive disorder, single episode, unspecified: Secondary | ICD-10-CM | POA: Diagnosis present

## 2015-08-08 DIAGNOSIS — F314 Bipolar disorder, current episode depressed, severe, without psychotic features: Secondary | ICD-10-CM

## 2015-08-08 DIAGNOSIS — T424X2A Poisoning by benzodiazepines, intentional self-harm, initial encounter: Secondary | ICD-10-CM | POA: Diagnosis not present

## 2015-08-08 DIAGNOSIS — Z7289 Other problems related to lifestyle: Secondary | ICD-10-CM | POA: Diagnosis present

## 2015-08-08 DIAGNOSIS — F319 Bipolar disorder, unspecified: Secondary | ICD-10-CM | POA: Diagnosis present

## 2015-08-08 DIAGNOSIS — G4700 Insomnia, unspecified: Secondary | ICD-10-CM | POA: Diagnosis present

## 2015-08-08 DIAGNOSIS — F3130 Bipolar disorder, current episode depressed, mild or moderate severity, unspecified: Secondary | ICD-10-CM | POA: Diagnosis present

## 2015-08-08 LAB — RAPID URINE DRUG SCREEN, HOSP PERFORMED
AMPHETAMINES: NOT DETECTED
BARBITURATES: NOT DETECTED
BENZODIAZEPINES: POSITIVE — AB
Cocaine: NOT DETECTED
Opiates: NOT DETECTED
TETRAHYDROCANNABINOL: POSITIVE — AB

## 2015-08-08 LAB — COMPREHENSIVE METABOLIC PANEL
ALBUMIN: 4.5 g/dL (ref 3.5–5.0)
ALT: 14 U/L (ref 14–54)
AST: 21 U/L (ref 15–41)
Alkaline Phosphatase: 34 U/L — ABNORMAL LOW (ref 38–126)
Anion gap: 15 (ref 5–15)
BUN: 16 mg/dL (ref 6–20)
CHLORIDE: 109 mmol/L (ref 101–111)
CO2: 22 mmol/L (ref 22–32)
CREATININE: 0.69 mg/dL (ref 0.44–1.00)
Calcium: 9.1 mg/dL (ref 8.9–10.3)
GFR calc Af Amer: >60 mL/min (ref >60)
GFR calc non Af Amer: >60 mL/min (ref >60)
GLUCOSE: 79 mg/dL (ref 65–99)
POTASSIUM: 3.6 mmol/L (ref 3.5–5.1)
Sodium: 146 mmol/L — ABNORMAL HIGH (ref 135–145)
TOTAL PROTEIN: 6.8 g/dL (ref 6.5–8.1)
Total Bilirubin: 0.8 mg/dL (ref 0.3–1.2)

## 2015-08-08 LAB — CBC
HEMATOCRIT: 34.2 % — AB (ref 36.0–46.0)
HEMOGLOBIN: 11.7 g/dL — AB (ref 12.0–15.0)
MCH: 33.3 pg (ref 26.0–34.0)
MCHC: 34.2 g/dL (ref 30.0–36.0)
MCV: 97.4 fL (ref 78.0–100.0)
PLATELETS: 211 10*3/uL (ref 150–400)
RBC: 3.51 MIL/uL — AB (ref 3.87–5.11)
RDW: 12.9 % (ref 11.5–15.5)
WBC: 6.7 10*3/uL (ref 4.0–10.5)

## 2015-08-08 LAB — ACETAMINOPHEN LEVEL: Acetaminophen (Tylenol), Serum: 16 ug/mL (ref 10–30)

## 2015-08-08 LAB — I-STAT BETA HCG BLOOD, ED (MC, WL, AP ONLY)

## 2015-08-08 LAB — ETHANOL: Alcohol, Ethyl (B): 112 mg/dL — ABNORMAL HIGH (ref <5)

## 2015-08-08 LAB — SALICYLATE LEVEL: Salicylate Lvl: <4 mg/dL (ref 2.8–30.0)

## 2015-08-08 MED ORDER — ALBUTEROL SULFATE HFA 108 (90 BASE) MCG/ACT IN AERS
2.0000 | INHALATION_SPRAY | RESPIRATORY_TRACT | Status: DC | PRN
  Filled 2015-08-08: qty 6.7

## 2015-08-08 MED ORDER — MAGNESIUM HYDROXIDE 400 MG/5ML PO SUSP: 30.0000 mL | Freq: Every day | ORAL | Status: DC | PRN

## 2015-08-08 MED ORDER — CHLORDIAZEPOXIDE HCL 25 MG PO CAPS
25.0000 mg | ORAL_CAPSULE | Freq: Three times a day (TID) | ORAL | Status: AC
  Filled 2015-08-08: qty 1

## 2015-08-08 MED ORDER — TRAZODONE HCL 50 MG PO TABS
50.0000 mg | ORAL_TABLET | Freq: Every day | ORAL | Status: DC
  Administered 2015-08-08 – 2015-08-10 (×3): 50 mg via ORAL
  Filled 2015-08-08 (×5): qty 1

## 2015-08-08 MED ORDER — LAMOTRIGINE 25 MG PO TABS
25.0000 mg | ORAL_TABLET | Freq: Every day | ORAL | Status: DC
  Administered 2015-08-08 – 2015-08-11 (×4): 25 mg via ORAL
  Filled 2015-08-08 (×8): qty 1

## 2015-08-08 MED ORDER — NICOTINE 21 MG/24HR TD PT24
21.0000 mg | MEDICATED_PATCH | Freq: Every day | TRANSDERMAL | Status: DC
  Filled 2015-08-08 (×3): qty 1

## 2015-08-08 MED ORDER — SENNOSIDES-DOCUSATE SODIUM 8.6-50 MG PO TABS: 2.0000 | ORAL_TABLET | Freq: Every evening | ORAL | Status: DC | PRN

## 2015-08-08 MED ORDER — ALUM & MAG HYDROXIDE-SIMETH 200-200-20 MG/5ML PO SUSP: 30.0000 mL | ORAL | Status: DC | PRN

## 2015-08-08 MED ORDER — ADULT MULTIVITAMIN W/MINERALS CH
1.0000 | ORAL_TABLET | Freq: Every day | ORAL | Status: DC
  Administered 2015-08-08 – 2015-08-11 (×4): 1 via ORAL
  Filled 2015-08-08 (×6): qty 1

## 2015-08-08 MED ORDER — LOPERAMIDE HCL 2 MG PO CAPS: 2.0000 mg | ORAL_CAPSULE | ORAL | Status: AC | PRN

## 2015-08-08 MED ORDER — IBUPROFEN 600 MG PO TABS: 600.0000 mg | ORAL_TABLET | Freq: Four times a day (QID) | ORAL | Status: DC | PRN

## 2015-08-08 MED ORDER — CHLORDIAZEPOXIDE HCL 25 MG PO CAPS
25.0000 mg | ORAL_CAPSULE | ORAL | Status: AC
  Administered 2015-08-10 – 2015-08-11 (×2): 25 mg via ORAL
  Filled 2015-08-08 (×2): qty 1

## 2015-08-08 MED ORDER — VITAMIN B-1 100 MG PO TABS
100.0000 mg | ORAL_TABLET | Freq: Every day | ORAL | Status: DC
  Administered 2015-08-09 – 2015-08-11 (×3): 100 mg via ORAL
  Filled 2015-08-08 (×5): qty 1

## 2015-08-08 MED ORDER — FERROUS SULFATE 325 (65 FE) MG PO TABS
325.0000 mg | ORAL_TABLET | Freq: Every day | ORAL | Status: DC
  Administered 2015-08-09 – 2015-08-11 (×3): 325 mg via ORAL
  Filled 2015-08-08 (×5): qty 1

## 2015-08-08 MED ORDER — CHLORDIAZEPOXIDE HCL 25 MG PO CAPS: 25.0000 mg | ORAL_CAPSULE | Freq: Four times a day (QID) | ORAL | Status: AC | PRN

## 2015-08-08 MED ORDER — HYDROXYZINE HCL 25 MG PO TABS
25.0000 mg | ORAL_TABLET | Freq: Four times a day (QID) | ORAL | Status: AC | PRN
  Administered 2015-08-10: 25 mg via ORAL
  Filled 2015-08-08: qty 1

## 2015-08-08 MED ORDER — CHLORDIAZEPOXIDE HCL 25 MG PO CAPS
25.0000 mg | ORAL_CAPSULE | Freq: Four times a day (QID) | ORAL | Status: AC
  Filled 2015-08-08: qty 1

## 2015-08-08 MED ORDER — THIAMINE HCL 100 MG/ML IJ SOLN: 100.0000 mg | Freq: Once | INTRAMUSCULAR | Status: DC

## 2015-08-08 MED ORDER — ACETAMINOPHEN 325 MG PO TABS: 650.0000 mg | ORAL_TABLET | Freq: Four times a day (QID) | ORAL | Status: DC | PRN

## 2015-08-08 MED ORDER — TETANUS-DIPHTH-ACELL PERTUSSIS 5-2.5-18.5 LF-MCG/0.5 IM SUSP
0.5000 mL | Freq: Once | INTRAMUSCULAR | Status: AC
  Administered 2015-08-08: 0.5 mL via INTRAMUSCULAR
  Filled 2015-08-08: qty 0.5

## 2015-08-08 MED ORDER — CHLORDIAZEPOXIDE HCL 25 MG PO CAPS: 25.0000 mg | ORAL_CAPSULE | Freq: Every day | ORAL | Status: DC

## 2015-08-08 MED ORDER — VITAMIN B-12 1000 MCG PO TABS
1000.0000 ug | ORAL_TABLET | Freq: Every day | ORAL | Status: DC
  Administered 2015-08-08 – 2015-08-11 (×4): 1000 ug via ORAL
  Filled 2015-08-08 (×6): qty 1

## 2015-08-08 MED ORDER — ONDANSETRON 4 MG PO TBDP: 4.0000 mg | ORAL_TABLET | Freq: Four times a day (QID) | ORAL | Status: AC | PRN

## 2015-08-08 NOTE — Tx Team (Signed)
Initial Interdisciplinary Treatment Plan   PATIENT STRESSORS: Marital or family conflict Substance abuse   PATIENT STRENGTHS: Communication skills Physical Health Supportive family/friends   PROBLEM LIST: Problem List/Patient Goals Date to be addressed Date deferred Reason deferred Estimated date of resolution  Substance abuse 08/08/2015     Depression 08/08/2015     Conflict with significant other 08/08/2015     Poor insight re etoh abuse 08/08/2015                                    DISCHARGE CRITERIA:  Improved stabilization in mood, thinking, and/or behavior Motivation to continue treatment in a less acute level of care Need for constant or close observation no longer present Verbal commitment to aftercare and medication compliance  PRELIMINARY DISCHARGE PLAN: Attend 12-step recovery group Return to previous living arrangement  PATIENT/FAMIILY INVOLVEMENT: This treatment plan has been presented to and reviewed with the patient, Virginia Hunter.  The patient and family have been given the opportunity to ask questions and make suggestions.  [place] Mon 08/08/2015, 10:27 AM
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
## 2015-08-08 NOTE — Progress Notes (Signed)
Recreation Therapy Notes  Animal-Assisted Activity (AAA) Program Checklist/Progress Notes Patient Eligibility Criteria Checklist & Daily Group note for Rec Tx Intervention  Date: 01.17.2017 Time: 2:45pm Location: [address] Dayroom    AAA/T Program Assumption of Risk Form signed by Patient/ or Parent Legal Guardian yes  Patient is free of allergies or sever asthma yes  Patient reports no fear of animals yes  Patient reports no history of cruelty to animals yes  Patient understands his/her participation is voluntary yes  Patient washes hands before animal contact yes  Patient washes hands after animal contact yes  Behavioral Response: Appropriate  Education: Hand Washing, Appropriate Animal Interaction   Education Outcome: Acknowledges education.   Clinical Observations/Feedback: Patient interacted appropriately with therapy dog and peers during session.   Meagen Limones L Sahana Boyland, LRT/CTRS  Dashae Wilcher L 08/08/2015 2:51 PM 

## 2015-08-08 NOTE — ED Notes (Signed)
[facility] has been called for transport 
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
## 2015-08-08 NOTE — ED Notes (Addendum)
Called PC notified of OD, closed case, no additional recommendations at this time.
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
## 2015-08-08 NOTE — [facility] Group Notes (Signed)
[facility] LCSW Group Therapy  08/08/2015   1:15 PM   Type of Therapy:  Group Therapy  Participation Level:  Active  Participation Quality:  Attentive, Sharing and Supportive  Affect:  Depressed and tearful  Cognitive:  Alert and Oriented  Insight:  Developing/Improving and Engaged  Engagement in Therapy:  Developing/Improving and Engaged  Modes of Intervention:  Clarification, Confrontation, Discussion, Education, Exploration, Limit-setting, Orientation, Problem-solving, Rapport Building, Dance movement psychotherapist, Socialization and Support  Summary of Progress/Problems: The topic for group therapy was feelings about diagnosis.  Pt actively participated in group discussion on their past and current diagnosis and how they feel towards this.  Pt also identified how society and family members judge them, based on their diagnosis as well as stereotypes and stigmas.  Patient was tearful in group and discussed how her illness is affecting her two young children. She expressed feelings of guilt and stated that she should be able to "just get over my depression because other people have it much worse." CSW and other group members provided patient with emotional support and encouragement.  Samuella Bruin, MSW, [employer] Clinical Social Worker [facility] [phone]

## 2015-08-08 NOTE — [facility] Group Notes (Signed)
Pt is a new admit.  She stated she feels okay and that she had a good day.  Her goal was to get her glasses, which she did.  Caroll Rancher, MHT

## 2015-08-09 MED ORDER — BACITRACIN-NEOMYCIN-POLYMYXIN OINTMENT TUBE
TOPICAL_OINTMENT | CUTANEOUS | Status: DC | PRN
  Filled 2015-08-09: qty 15
  Filled 2015-08-09: qty 1

## 2015-08-09 MED ORDER — BUPROPION HCL ER (SR) 100 MG PO TB12
100.0000 mg | ORAL_TABLET | Freq: Every day | ORAL | Status: DC
  Administered 2015-08-09 – 2015-08-10 (×2): 100 mg via ORAL
  Filled 2015-08-09 (×5): qty 1

## 2015-08-09 MED ORDER — NALTREXONE HCL 50 MG PO TABS
25.0000 mg | ORAL_TABLET | Freq: Every day | ORAL | Status: DC
  Administered 2015-08-09 – 2015-08-11 (×3): 25 mg via ORAL
  Filled 2015-08-09 (×6): qty 1

## 2015-08-09 NOTE — Progress Notes (Signed)
Recreation Therapy Notes  Date: 01.18.2017 Time: 9:30am Location: 300 Hall Dayroom   Group Topic: Stress Management  Goal Area(s) Addresses:  Patient will actively participate in stress management techniques presented during session.   Behavioral Response: Did not attend.    Marykay Lex Arianna Haydon, LRT/CTRS        Japleen Tornow L 08/09/2015 11:50 AM

## 2015-08-09 NOTE — Progress Notes (Signed)
Patient ID: Virginia Hunter, female   DOB: [birth year], 30 y.o.   MRN: [id number]  DAR: Pt. Denies SI/HI and A/V Hallucinations. She reports sleep is good, appetite is good, energy level is normal, and concentration is good. She rates depression and anxiety 2/10 and hopelessness 1/10. She reported some "jitteriness" after her first dose of Wellbutrin. MD Dub Mikes made aware, patient reported later that this has decreased. She denies withdrawal symptoms. Patient does not report any pain or discomfort at this time. Support and encouragement provided to the patient. Scheduled medications administered to patient is Patient is receptive and cooperative but remains minimal. She is seen in the milieu at times but stays mostly to herself. Q15 minute checks are maintained for safety.

## 2015-08-09 NOTE — [facility] Group Notes (Signed)
   [facility] LCSW Aftercare Discharge Planning Group Note  08/09/2015  8:45 AM   Participation Quality: Alert, Appropriate and Oriented  Mood/Affect: Depressed and tearful   Depression Rating: 2-3  Anxiety Rating: 2-3  Thoughts of Suicide: Pt denies SI/HI  Will you contract for safety? Yes  Current AVH: Pt denies  Plan for Discharge/Comments: Pt attended discharge planning group and actively participated in group. CSW provided pt with today's workbook. Patient reports that she plans to return home to follow up with outpatient services. Patient minimizes her symptoms but appears depressed and anxious and was observed being tearful in group.  Transportation Means: Pt reports access to transportation  Supports: No supports mentioned at this time  Samuella Bruin, MSW, [employer] Clinical Social Worker [employer] [phone]

## 2015-08-09 NOTE — Progress Notes (Signed)
[facility] MD Progress Note  08/09/2015 12:07 PM Virginia Hunter  MRN:  [id number] Subjective:  Virginia Hunter is still dealing with a lot of worry. She did sleep better last night. She got a dose of Wellbutrin what made her more anxious but she was reassured that the increased anxiety would be temporary. She is worried that her BF will lose his job for not being able to go to work having to take care of the kids while she is here. She is more hopeful today  Principal Problem: Bipolar disorder with current episode depressed ([place]) Diagnosis:   Patient Active Problem List   Diagnosis Date Noted  . Alcohol use disorder, severe, dependence ([place]) [F10.20] 08/08/2015  . Self-mutilation [F48.9] 08/08/2015  . Bipolar affective disorder, current episode manic with psychotic symptoms ([place]) [F31.2] 08/02/2014  . Substance induced mood disorder ([place]) [F19.94]   . Intrauterine normal pregnancy [Z34.90] 01/03/2014  . Bipolar disorder with current episode depressed ([place]) [F31.30] 10/01/2012  . Bulimia nervosa, purging type [F50.2] 10/01/2012   Total Time spent with patient: 30 minutes  Past Psychiatric History: see admission H and P  Past Medical History:  Past Medical History  Diagnosis Date  . Medical history non-contributory     Past Surgical History  Procedure Laterality Date  . Cesarean section    . Knee surgery    . Cesarean section N/A 01/03/2014    Procedure: Repeat CESAREAN SECTION;  Surgeon: Annalee Genta, DO;  Location: [facility];  Service: Obstetrics;  Laterality: N/A;   Family History: History reviewed. No pertinent family history. Family Psychiatric  History: see admission H and P Social History:  History  Alcohol Use  . Yes     History  Drug Use No    Social History   Social History  . Marital Status: Single    Spouse Name: N/A  . Number of Children: N/A  . Years of Education: N/A   Social History Main Topics  . Smoking status: Never Smoker   . Smokeless tobacco: None  .  Alcohol Use: Yes  . Drug Use: No  . Sexual Activity: Not Asked   Other Topics Concern  . None   Social History Narrative   Additional Social History:                         Sleep: Fair  Appetite:  Fair  Current Medications: Current Facility-Administered Medications  Medication Dose Route Frequency Provider Last Rate Last Dose  . acetaminophen (TYLENOL) tablet 650 mg  650 mg Oral Q6H PRN Encarnacion Slates, NP      . albuterol (PROVENTIL HFA;VENTOLIN HFA) 108 (90 Base) MCG/ACT inhaler 2 puff  2 puff Inhalation Q4H PRN Encarnacion Slates, NP      . alum & mag hydroxide-simeth (MAALOX/MYLANTA) 200-200-20 MG/5ML suspension 30 mL  30 mL Oral Q4H PRN Encarnacion Slates, NP      . buPROPion [facility] SR) 12 hr tablet 100 mg  100 mg Oral Daily Nicholaus Bloom, MD   100 mg at 08/09/15 1158  . chlordiazePOXIDE (LIBRIUM) capsule 25 mg  25 mg Oral Q6H PRN Encarnacion Slates, NP      . chlordiazePOXIDE (LIBRIUM) capsule 25 mg  25 mg Oral TID Encarnacion Slates, NP   25 mg at 08/09/15 1158   Followed by  . [START ON 08/10/2015] chlordiazePOXIDE (LIBRIUM) capsule 25 mg  25 mg Oral BH-qamhs Encarnacion Slates, NP  Followed by  . [START ON 08/12/2015] chlordiazePOXIDE (LIBRIUM) capsule 25 mg  25 mg Oral Daily Encarnacion Slates, NP      . ferrous sulfate tablet 325 mg  325 mg Oral Q breakfast Encarnacion Slates, NP   325 mg at 08/09/15 0804  . hydrOXYzine (ATARAX/VISTARIL) tablet 25 mg  25 mg Oral Q6H PRN Encarnacion Slates, NP      . ibuprofen (ADVIL,MOTRIN) tablet 600 mg  600 mg Oral Q6H PRN Encarnacion Slates, NP      . lamoTRIgine (LAMICTAL) tablet 25 mg  25 mg Oral Daily Nicholaus Bloom, MD   25 mg at 08/09/15 0805  . loperamide (IMODIUM) capsule 2-4 mg  2-4 mg Oral PRN Encarnacion Slates, NP      . magnesium hydroxide (MILK OF MAGNESIA) suspension 30 mL  30 mL Oral Daily PRN Encarnacion Slates, NP      . multivitamin with minerals tablet 1 tablet  1 tablet Oral Daily Encarnacion Slates, NP   1 tablet at 08/09/15 0804  . naltrexone (DEPADE)  tablet 25 mg  25 mg Oral Daily Nicholaus Bloom, MD   25 mg at 08/09/15 1158  . ondansetron (ZOFRAN-ODT) disintegrating tablet 4 mg  4 mg Oral Q6H PRN Encarnacion Slates, NP      . senna-docusate (Senokot-S) tablet 2 tablet  2 tablet Oral QHS PRN Encarnacion Slates, NP      . thiamine (B-1) injection 100 mg  100 mg Intramuscular Once Encarnacion Slates, NP   100 mg at 08/08/15 1203  . thiamine (VITAMIN B-1) tablet 100 mg  100 mg Oral Daily Encarnacion Slates, NP   100 mg at 08/09/15 0804  . traZODone (DESYREL) tablet 50 mg  50 mg Oral QHS Encarnacion Slates, NP   50 mg at 08/08/15 2106  . vitamin B-12 (CYANOCOBALAMIN) tablet 1,000 mcg  1,000 mcg Oral Daily Encarnacion Slates, NP   1,000 mcg at 08/09/15 7628    Lab Results:  Results for orders placed or performed during the hospital encounter of 08/07/15 (from the past 48 hour(s))  Comprehensive metabolic panel     Status: Abnormal   Collection Time: 08/07/15 11:51 PM  Result Value Ref Range   Sodium 146 (H) 135 - 145 mmol/L   Potassium 3.6 3.5 - 5.1 mmol/L   Chloride 109 101 - 111 mmol/L   CO2 22 22 - 32 mmol/L   Glucose, Bld 79 65 - 99 mg/dL   BUN 16 6 - 20 mg/dL   Creatinine, Ser 0.69 0.44 - 1.00 mg/dL   Calcium 9.1 8.9 - 10.3 mg/dL   Total Protein 6.8 6.5 - 8.1 g/dL   Albumin 4.5 3.5 - 5.0 g/dL   AST 21 15 - 41 U/L   ALT 14 14 - 54 U/L   Alkaline Phosphatase 34 (L) 38 - 126 U/L   Total Bilirubin 0.8 0.3 - 1.2 mg/dL   GFR calc non Af Amer >60 >60 mL/min   GFR calc Af Amer >60 >60 mL/min    Comment: (NOTE) The eGFR has been calculated using the CKD EPI equation. This calculation has not been validated in all clinical situations. eGFR's persistently <60 mL/min signify possible Chronic Kidney Disease.    Anion gap 15 5 - 15  Ethanol (ETOH)     Status: Abnormal   Collection Time: 08/07/15 11:51 PM  Result Value Ref Range   Alcohol, Ethyl (B) 112 (H) <5 mg/dL  Comment:        LOWEST DETECTABLE LIMIT FOR SERUM ALCOHOL IS 5 mg/dL FOR MEDICAL PURPOSES ONLY    Salicylate level     Status: None   Collection Time: 08/07/15 11:51 PM  Result Value Ref Range   Salicylate Lvl <6.1 2.8 - 30.0 mg/dL  Acetaminophen level     Status: None   Collection Time: 08/07/15 11:51 PM  Result Value Ref Range   Acetaminophen (Tylenol), Serum 16 10 - 30 ug/mL    Comment:        THERAPEUTIC CONCENTRATIONS VARY SIGNIFICANTLY. A RANGE OF 10-30 ug/mL MAY BE AN EFFECTIVE CONCENTRATION FOR MANY PATIENTS. HOWEVER, SOME ARE BEST TREATED AT CONCENTRATIONS OUTSIDE THIS RANGE. ACETAMINOPHEN CONCENTRATIONS >150 ug/mL AT 4 HOURS AFTER INGESTION AND >50 ug/mL AT 12 HOURS AFTER INGESTION ARE OFTEN ASSOCIATED WITH TOXIC REACTIONS.   CBC     Status: Abnormal   Collection Time: 08/07/15 11:51 PM  Result Value Ref Range   WBC 6.7 4.0 - 10.5 K/uL   RBC 3.51 (L) 3.87 - 5.11 MIL/uL   Hemoglobin 11.7 (L) 12.0 - 15.0 g/dL   HCT 34.2 (L) 36.0 - 46.0 %   MCV 97.4 78.0 - 100.0 fL   MCH 33.3 26.0 - 34.0 pg   MCHC 34.2 30.0 - 36.0 g/dL   RDW 12.9 11.5 - 15.5 %   Platelets 211 150 - 400 K/uL  I-Stat beta hCG blood, ED ([facility], [facility], [facility] only)     Status: None   Collection Time: 08/07/15 11:59 PM  Result Value Ref Range   I-stat hCG, quantitative <5.0 <5 mIU/mL   Comment 3            Comment:   GEST. AGE      CONC.  (mIU/mL)   <=1 WEEK        5 - 50     2 WEEKS       50 - 500     3 WEEKS       100 - 10,000     4 WEEKS     1,000 - 30,000        FEMALE AND NON-PREGNANT FEMALE:     LESS THAN 5 mIU/mL   Urine rapid drug screen (hosp performed) (Not at [facility])     Status: Abnormal   Collection Time: 08/08/15  6:04 AM  Result Value Ref Range   Opiates NONE DETECTED NONE DETECTED   Cocaine NONE DETECTED NONE DETECTED   Benzodiazepines POSITIVE (A) NONE DETECTED   Amphetamines NONE DETECTED NONE DETECTED   Tetrahydrocannabinol POSITIVE (A) NONE DETECTED   Barbiturates NONE DETECTED NONE DETECTED    Comment:        DRUG SCREEN FOR MEDICAL PURPOSES ONLY.  IF CONFIRMATION IS  NEEDED FOR ANY PURPOSE, NOTIFY LAB WITHIN 5 DAYS.        LOWEST DETECTABLE LIMITS FOR URINE DRUG SCREEN Drug Class       Cutoff (ng/mL) Amphetamine      1000 Barbiturate      200 Benzodiazepine   950 Tricyclics       932 Opiates          300 Cocaine          300 THC              50     Physical Findings: AIMS: Facial and Oral Movements Muscles of Facial Expression: None, normal Lips and Perioral Area: None, normal Jaw: None, normal Tongue: None, normal,Extremity  Movements Upper (arms, wrists, hands, fingers): None, normal Lower (legs, knees, ankles, toes): None, normal, Trunk Movements Neck, shoulders, hips: None, normal, Overall Severity Severity of abnormal movements (highest score from questions above): None, normal Incapacitation due to abnormal movements: None, normal Patient's awareness of abnormal movements (rate only patient's report): No Awareness, Dental Status Current problems with teeth and/or dentures?: No Does patient usually wear dentures?: No  CIWA:  CIWA-Ar Total: 2 COWS:     Musculoskeletal: Strength & Muscle Tone: within normal limits Gait & Station: normal Patient leans: normal  Psychiatric Specialty Exam: Review of Systems  Constitutional: Negative.   HENT: Negative.   Eyes: Negative.   Respiratory: Negative.   Cardiovascular: Negative.   Gastrointestinal: Negative.   Genitourinary: Negative.   Musculoskeletal: Negative.   Skin: Negative.   Neurological: Negative.   Endo/Heme/Allergies: Negative.   Psychiatric/Behavioral: Positive for depression and substance abuse. The patient is nervous/anxious.     Blood pressure 133/83, pulse 76, temperature 97.6 F (36.4 C), temperature source Oral, resp. rate 16, height 5' 2"  (1.575 m), weight 55.339 kg (122 lb), unknown if currently breastfeeding.Body mass index is 22.31 kg/(m^2).  General Appearance: Fairly Groomed  Engineer, water::  Minimal  Speech:  Clear and Coherent  Volume:  Decreased  Mood:   Anxious, Depressed and worried  Affect:  Restricted  Thought Process:  Coherent and Goal Directed  Orientation:  Full (Time, Place, and Person)  Thought Content:  symptoms events worries concerns  Suicidal Thoughts:  No  Homicidal Thoughts:  No  Memory:  Immediate;   Fair Recent;   Fair Remote;   Fair  Judgement:  Fair  Insight:  Present and Shallow  Psychomotor Activity:  Decreased  Concentration:  Fair  Recall:  [employer] of Knowledge:Fair  Language: Fair  Akathisia:  No  Handed:  Right  AIMS (if indicated):     Assets:  Desire for Improvement Housing Social Support  ADL's:  Intact  Cognition: WNL  Sleep:  Number of Hours: 6   Treatment Plan Summary: Daily contact with patient to assess and evaluate symptoms and progress in treatment and Medication management Supportive approach/coping skills Alcohol abuse; continue the Librium detox/work a relapse prevention plan Mood instability; continue the Lamictal 25 mg daily Depression;will start the Wellbutrin SR 100 mg and monitor for increased anxiety Self mutilation; will start the Naltrexone 25 mg CBT/mindfulness Rafiel Mecca A 08/09/2015, 12:07 PM

## 2015-08-09 NOTE — Progress Notes (Signed)
Pt was observed sitting alone in the hallway when the shift started this evening.  Pt was admitted to the unit today.  She denies SI/HI/AVH at this time.  She denies any withdrawal symptoms and does not want to take the Librium.  She does want something to help her sleep, and writer informed her that she has trazodone available.  Pt was encouraged to make her needs known to staff.  She tells Clinical research associate that she feels safe on the unit.  Pt voices no needs or concerns at this time.  Support and encouragement offered,  Discharge plans are in process.  Safety maintained with q15 minute checks.

## 2015-08-09 NOTE — [facility] Counselor (Signed)
CSW attempted to meet with patient to complete PSA and discuss discharge plans. Patient in bed and did not respond to CSW interaction. Patient with covers over her head. CSW will complete PSA at a later time.   Samuella Bruin, MSW, [employer] Clinical Social Worker [facility] [phone]

## 2015-08-09 NOTE — Progress Notes (Signed)
Pt attended NA group this evening.  

## 2015-08-09 NOTE — [facility] Group Notes (Signed)
[facility] LCSW Group Therapy 08/09/2015  1:15 PM Type of Therapy: Group Therapy Participation Level: Active  Participation Quality: Attentive, Sharing and Supportive  Affect: Depressed and anxious  Cognitive: Alert and Oriented  Insight: Developing/Improving and Engaged  Engagement in Therapy: Developing/Improving and Engaged  Modes of Intervention: Clarification, Confrontation, Discussion, Education, Exploration, Limit-setting, Orientation, Problem-solving, Rapport Building, Dance movement psychotherapist, Socialization and Support  Summary of Progress/Problems: The topic for group today was emotional regulation. This group focused on both positive and negative emotion identification and allowed group members to process ways to identify feelings, regulate negative emotions, and find healthy ways to manage internal/external emotions. Group members were asked to reflect on a time when their reaction to an emotion led to a negative outcome and explored how alternative responses using emotion regulation would have benefited them. Group members were also asked to discuss a time when emotion regulation was utilized when a negative emotion was experienced. Patient identified struggling with anger that stems from being adopted and her loss of her [place] identity. CSW and other group members provided patient with emotional support and encouragement.  Samuella Bruin, MSW, [employer] Clinical Social Worker [facility] [phone]

## 2015-08-09 NOTE — Tx Team (Signed)
Interdisciplinary Treatment Plan Update (Adult) Date: 08/09/2015    Time Reviewed: 9:30 AM  Progress in Treatment: Attending groups: Continuing to assess, patient new to milieu Participating in groups: Continuing to assess, patient new to milieu Taking medication as prescribed: Yes Tolerating medication: Yes Family/Significant other contact made: No, CSW assessing for appropriate contacts Patient understands diagnosis: Yes Discussing patient identified problems/goals with staff: Yes Medical problems stabilized or resolved: Yes Denies suicidal/homicidal ideation: Yes Issues/concerns per patient self-inventory: Yes Other:  New problem(s) identified: N/A  Discharge Plan or Barriers: CSW continuing to assess, patient new to milieu. Patient plans to return home to follow up with outpatient services.   Reason for Continuation of Hospitalization:  Depression Anxiety Medication Stabilization   Comments: N/A  Estimated length of stay: 3-5 days    Patient is a 30 year old female admitted for depression and SI with overdose on Xanax and a box of wine. Patient also had cuts to her forearm. Patient will benefit from crisis stabilization, medication evaluation, group therapy and psycho education in addition to case management for discharge planning. At discharge, it is recommended that Pt remain compliant with established discharge plan and continued treatment.   Review of initial/current patient goals per problem list:  1. Goal(s): Patient will participate in aftercare plan   Met: Yes   Target date: 3-5 days post admission date   As evidenced by: Patient will participate within aftercare plan AEB aftercare provider and housing plan at discharge being identified.  1/18: Goal met. Patient plans to return home to follow up with outpatient services.    2. Goal (s): Patient will exhibit decreased depressive symptoms and suicidal ideations.   Met: Yes   Target date: 3-5 days  post admission date   As evidenced by: Patient will utilize self rating of depression at 3 or below and demonstrate decreased signs of depression or be deemed stable for discharge by MD.  1/18: Goal met. Patient rates depression at 2-3 today.   3. Goal(s): Patient will demonstrate decreased signs and symptoms of anxiety.   Met: Yes   Target date: 3-5 days post admission date   As evidenced by: Patient will utilize self rating of anxiety at 3 or below and demonstrated decreased signs of anxiety, or be deemed stable for discharge by MD  1/18: Goal met. Patient rates depression at 2-3 today.    4. Goal(s): Patient will demonstrate decreased signs of withdrawal due to substance abuse   Met: Yes   Target date: 3-5 days post admission date   As evidenced by: Patient will produce a CIWA/COWS score of 0, have stable vitals signs, and no symptoms of withdrawal  1/18: Goal met. No withdrawal symptoms reported at this time per medical chart.      Attendees: Patient:    Family:    Physician: Dr. Parke Poisson; Dr. Sabra Heck 08/09/2015 9:30 AM  Nursing: Franco Nones, [address] Loletta Specter, Ottie Glazier, RN 08/09/2015 9:30 AM  Clinical Social Worker: Tilden Fossa, LCSWA 08/09/2015 9:30 AM  Other: Peri Maris, LCSWA; [place], LCSW  08/09/2015 9:30 AM  Other: Norberto Sorenson, P4CC 08/09/2015 9:30 AM  Other: Lars Pinks, Case Manager 08/09/2015 9:30 AM  Other:  Agustina Caroli, May Augstin, NP 08/09/2015 9:30 AM  Other:    Other:    Other:    Other:     Scribe for Treatment Team:  Tilden Fossa, MSW, [employer] [phone]

## 2015-08-10 DIAGNOSIS — F319 Bipolar disorder, unspecified: Secondary | ICD-10-CM | POA: Insufficient documentation

## 2015-08-10 MED ORDER — BUPROPION HCL ER (XL) 150 MG PO TB24
150.0000 mg | ORAL_TABLET | Freq: Every day | ORAL | Status: DC
  Administered 2015-08-11: 150 mg via ORAL
  Filled 2015-08-10 (×4): qty 1

## 2015-08-10 MED ORDER — BUPROPION HCL ER (XL) 150 MG PO TB24: 150.0000 mg | ORAL_TABLET | Freq: Every day | ORAL | Status: DC

## 2015-08-10 NOTE — [facility] Suicide Risk Assessment (Signed)
[facility] INPATIENT:  Family/Significant Other Suicide Prevention Education  Suicide Prevention Education:  Education Completed; boyfriend Pricilla Larsson [phone],  (name of family member/significant other) has been identified by the patient as the family member/significant other with whom the patient will be residing, and identified as the person(s) who will aid the patient in the event of a mental health crisis (suicidal ideations/suicide attempt).  With written consent from the patient, the family member/significant other has been provided the following suicide prevention education, prior to the and/or following the discharge of the patient.  The suicide prevention education provided includes the following:  Suicide risk factors  Suicide prevention and interventions  National Suicide Hotline telephone number  [facility] assessment telephone number  [facility] Emergency Assistance 911  [facility] and/or Residential Mobile Crisis Unit telephone number  Request made of family/significant other to:  Remove weapons (e.g., guns, rifles, knives), all items previously/currently identified as safety concern.    Remove drugs/medications (over-the-counter, prescriptions, illicit drugs), all items previously/currently identified as a safety concern.  The family member/significant other verbalizes understanding of the suicide prevention education information provided.  The family member/significant other agrees to remove the items of safety concern listed above.  Taichi Repka, [place] 08/10/2015, 11:37 AM

## 2015-08-10 NOTE — Plan of Care (Signed)
Problem: Alteration in mood & ability to function due to Goal: LTG-Pt reports reduction in suicidal thoughts (Patient reports reduction in suicidal thoughts and is able to verbalize a safety plan for whenever patient is feeling suicidal)  Outcome: Progressing Pt denied si/hi and verbally contracted for safety.

## 2015-08-10 NOTE — Progress Notes (Signed)
Patient ID: Virginia Hunter, female   DOB: 03-12-1986, 30 y.o.   MRN: [id number] D: Client in dayroom, interacting with peers and watching TV. Client denies depression or  withdrawal, reports "being here around adults has been nice, I'm around children all day" A: Writer provided emotional support encouraged client to consider group therapy. Medications reviewed and administered as ordered. Staff will monitor q73min for safety. R: client is safe on the unit, attended group.

## 2015-08-10 NOTE — Progress Notes (Addendum)
DAR NOTE: Patient presents with anxious affect and depressed mood.  Denies pain, auditory and visual hallucinations.  Rates depression at 0, hopelessness at 0, and anxiety at 0.  Maintained on routine safety checks.  Medications given as prescribed.  Support and encouragement offered as needed. States goal for today is "intergrate the medicine into normal everyday activity."  Patient observed socializing with peers in the dayroom.  Offered no complaint. Pt's safety ensured with 15 minute and environmental checks. Pt currently denies SI/HI and A/V hallucinations. Pt verbally agrees to seek staff if SI/HI or A/VH occurs and to consult with staff before acting on these thoughts. Will continue POC.

## 2015-08-10 NOTE — Progress Notes (Signed)
Patient ID: Virginia Hunter, female   DOB: 09/25/1985, 30 y.o.   MRN: [id number] D: Client reports admission has been helpful "I need to be here, I should have been here a month ago" "I mean things have been going down hill for a long time, we went out one night bowling and we had drinks, not my husband he doesn't touch the stuff, and one thing lead to another" "I know I can't drink because I start mouthing off and I did and by the time we got home, my husband was upset and he mumbled something about being tired and I thought he said he was tired of me and that's when this happened" Client was pointing to cut to her left arm. Client also admit she had has cuts all over her body. "this admission has been the most helpful thing, I really needed it" "everybody has been so nice and the doctor listens, I told him I have to have an antidepressant that's not going to put me in a stupor"  "I mean I can't be in a stupor trying to take care of two kids and one of them have ADHD"  "the mix of medication he has me on now seems to be working" A: Clinical research associate provided emotional support listening, encouraged client continue family therapy and medications upon discharge. Staff will monitor q33min for safety. R: Client is safe on the unit, attended group.

## 2015-08-10 NOTE — [facility] Group Notes (Signed)
[facility] Group Notes:  (Nursing/MHT/Case Management/Adjunct)  Date:  08/10/2015  Time:  2:10 PM   Type of Therapy:  Psychoeducational Skills  Participation Level:  Did Not Attend  Participation Quality:  DID NOT ATTEND  Affect:  DID NOT ATTEND  Cognitive:  DID NOT ATTEND  Insight:  None  Engagement in Group:  DID NOT ATTEND  Modes of Intervention:  DID NOT ATTEND  Summary of Progress/Problems: Pt did not attend patient self inventory group.  Bethann Punches 08/10/2015, 2:10 PM

## 2015-08-10 NOTE — Progress Notes (Signed)
[facility] Group Notes:  (Nursing/MHT/Case Management/Adjunct)  Date:  08/10/2015  Time:  2045  Type of Therapy:  wrap up group  Participation Level:  Active  Participation Quality:  Appropriate, Attentive, Sharing and Supportive  Affect:  Appropriate  Cognitive:  Appropriate  Insight:  Good and Improving  Engagement in Group:  Engaged  Modes of Intervention:  Clarification, Education and Support  Summary of Progress/Problems:  Virginia Hunter 08/10/2015, 9:38 PM

## 2015-08-10 NOTE — [facility] Group Notes (Signed)
[facility] LCSW Group Therapy 08/10/2015 1:15 PM Type of Therapy: Group Therapy Participation Level: Active  Participation Quality: Attentive, Sharing and Supportive  Affect: Depressed and tearful  Cognitive: Alert and Oriented  Insight: Developing/Improving and Engaged  Engagement in Therapy: Developing/Improving and Engaged  Modes of Intervention: Activity, Clarification, Confrontation, Discussion, Education, Exploration, Limit-setting, Orientation, Problem-solving, Rapport Building, Dance movement psychotherapist, Socialization and Support  Summary of Progress/Problems: Patient was attentive and engaged with speaker from [employer]. Patient was attentive to speaker while they shared their story of dealing with mental health and overcoming it. Patient expressed interest in their programs and services and received information on their agency. Patient processed ways they can relate to the speaker.   Samuella Bruin, MSW, [employer] Clinical Social Worker [facility] [phone]
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
## 2015-08-10 NOTE — Progress Notes (Signed)
[facility] referral made at patient's request.   Samuella Bruin, MSW, [facility] Clinical Social Worker [facility] [phone]

## 2015-08-11 MED ORDER — BACITRACIN-NEOMYCIN-POLYMYXIN OINTMENT TUBE: 1.0000 "application " | TOPICAL_OINTMENT | CUTANEOUS | Status: AC | PRN

## 2015-08-11 MED ORDER — ACETAMINOPHEN 500 MG PO TABS: 1000.0000 mg | ORAL_TABLET | Freq: Four times a day (QID) | ORAL | Status: AC | PRN

## 2015-08-11 MED ORDER — FERROUS SULFATE 325 (65 FE) MG PO TABS: 325.0000 mg | ORAL_TABLET | Freq: Every day | ORAL | Status: AC

## 2015-08-11 MED ORDER — SENNOSIDES-DOCUSATE SODIUM 8.6-50 MG PO TABS: 2.0000 | ORAL_TABLET | Freq: Every evening | ORAL | Status: AC | PRN

## 2015-08-11 MED ORDER — IBUPROFEN 200 MG PO TABS: 600.0000 mg | ORAL_TABLET | Freq: Four times a day (QID) | ORAL | Status: AC | PRN

## 2015-08-11 MED ORDER — VITAMIN B-12 1000 MCG PO TABS: 1000.0000 ug | ORAL_TABLET | Freq: Every day | ORAL | Status: AC

## 2015-08-11 MED ORDER — BUPROPION HCL ER (XL) 150 MG PO TB24: 150.0000 mg | ORAL_TABLET | Freq: Every day | ORAL | Status: AC

## 2015-08-11 MED ORDER — LAMOTRIGINE 25 MG PO TABS
25.0000 mg | ORAL_TABLET | Freq: Every day | ORAL | Status: AC
Start: 2015-08-11 — End: ?

## 2015-08-11 MED ORDER — TRAZODONE HCL 50 MG PO TABS: 50.0000 mg | ORAL_TABLET | Freq: Every day | ORAL | Status: AC

## 2015-08-11 MED ORDER — NALTREXONE HCL 50 MG PO TABS: 25.0000 mg | ORAL_TABLET | Freq: Every day | ORAL | Status: AC

## 2015-08-11 MED ORDER — ALBUTEROL SULFATE HFA 108 (90 BASE) MCG/ACT IN AERS: 2.0000 | INHALATION_SPRAY | RESPIRATORY_TRACT | Status: AC | PRN

## 2015-08-11 NOTE — Progress Notes (Signed)
  [facility] Adult Case Management Discharge Plan :  Will you be returning to the same living situation after discharge:  Yes,  patient plans to return home At discharge, do you have transportation home?: Yes,  patient's boyfriend to provide transportation Do you have the ability to pay for your medications: Yes,  patient will be provided with prescriptions at discharge  Release of information consent forms completed and in the chart;  Patient's signature needed at discharge.  Patient to Follow up at: Follow-up Information    Follow up with [facility].   Why:  Therapy appt with Dahlia Client on Monday January 30th at 11am; Medication management appt with Crystal on Wednesday February 1st at 3pm. Call office if you need to reschedule.    Contact information:   [address], [place], [place] [address] Phone: [phone]      Next level of care provider has access to [facility] Link:no  Safety Planning and Suicide Prevention discussed: Yes,  with patient and boyfriend  Have you used any form of tobacco in the last 30 days? (Cigarettes, Smokeless Tobacco, Cigars, and/or Pipes): No  Has patient been referred to the Quitline?: N/A patient is not a smoker  Patient has been referred for addiction treatment: Yes  Guliana Weyandt, [place] 08/11/2015, 9:40 AM

## 2015-08-11 NOTE — [facility] Group Notes (Signed)
   [facility] LCSW Aftercare Discharge Planning Group Note  08/11/2015  8:45 AM   Participation Quality: Alert, Appropriate and Oriented  Mood/Affect: Appropriate  Depression Rating: 0  Anxiety Rating: Reports moderate anxiety related to returning home and financial stressors  Thoughts of Suicide: Pt denies SI/HI  Will you contract for safety? Yes  Current AVH: Pt denies  Plan for Discharge/Comments: Pt attended discharge planning group and actively participated in group. CSW provided pt with today's workbook. Patient plans to return home with family to follow up with outpatient services.  Transportation Means: Pt reports access to transportation  Supports: No supports mentioned at this time  Samuella Bruin, MSW, [employer] Clinical Social Worker [employer] [phone]

## 2015-08-11 NOTE — Progress Notes (Signed)
Recreation Therapy Notes  Date: 01.20.2017 Time: 9:30am Location: [address] Hall Group Room   Group Topic: Stress Management  Goal Area(s) Addresses:  Patient will actively participate in stress management techniques presented during session.   Behavioral Response: Did not attend.   Marykay Lex Audyn Dimercurio, LRT/CTRS        Kailany Dinunzio L 08/11/2015 2:01 PM
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
## 2015-08-11 NOTE — Progress Notes (Signed)
Virginia Hunter is readied for DC as she completes her daily assessment. She writes on it she denies SI today and she rates her depression, hopelessness and anxiety " 0 / 1-3 / 7 ", respectively She is given her Dc AVS, it is reviewed with her and she states verbal understanding. She is given prescriptions for meds and an AVS cc. All belongings in pt's locker are returned to her and she was escorted to the bldg entrance and dc'd ambulatory.
